# Patient Record
Sex: Male | Born: 1980
Health system: Southern US, Community
[De-identification: ages and names within clinical notes are randomized; demographics above are authoritative.]

## PROBLEM LIST (undated history)

## (undated) DIAGNOSIS — R079 Chest pain, unspecified: Secondary | ICD-10-CM

## (undated) DIAGNOSIS — J301 Allergic rhinitis due to pollen: Secondary | ICD-10-CM

## (undated) DIAGNOSIS — R011 Cardiac murmur, unspecified: Secondary | ICD-10-CM

## (undated) DIAGNOSIS — I351 Nonrheumatic aortic (valve) insufficiency: Secondary | ICD-10-CM

## (undated) DIAGNOSIS — J3089 Other allergic rhinitis: Secondary | ICD-10-CM

## (undated) DIAGNOSIS — H1045 Other chronic allergic conjunctivitis: Secondary | ICD-10-CM

## (undated) DIAGNOSIS — I340 Nonrheumatic mitral (valve) insufficiency: Secondary | ICD-10-CM

## (undated) DIAGNOSIS — R42 Dizziness and giddiness: Secondary | ICD-10-CM

## (undated) DIAGNOSIS — E7800 Pure hypercholesterolemia, unspecified: Secondary | ICD-10-CM

## (undated) DIAGNOSIS — R1013 Epigastric pain: Secondary | ICD-10-CM

## (undated) HISTORY — DX: Allergic rhinitis due to pollen: J30.1

## (undated) HISTORY — DX: Dizziness and giddiness: R42

## (undated) HISTORY — DX: Nonrheumatic mitral (valve) insufficiency: I34.0

## (undated) HISTORY — PX: TRANSTHORACIC ECHOCARDIOGRAM: SHX275

## (undated) HISTORY — DX: Epigastric pain: R10.13

## (undated) HISTORY — DX: Nonrheumatic aortic (valve) insufficiency: I35.1

## (undated) HISTORY — DX: Other allergic rhinitis: J30.89

## (undated) HISTORY — DX: Other chronic allergic conjunctivitis: H10.45

## (undated) HISTORY — DX: Chest pain, unspecified: R07.9

---

## 2014-06-16 DIAGNOSIS — I351 Nonrheumatic aortic (valve) insufficiency: Secondary | ICD-10-CM

## 2014-06-16 HISTORY — DX: Nonrheumatic aortic (valve) insufficiency: I35.1

## 2014-07-06 ENCOUNTER — Encounter: Payer: Self-pay | Admitting: Family Medicine

## 2014-07-06 ENCOUNTER — Ambulatory Visit (INDEPENDENT_AMBULATORY_CARE_PROVIDER_SITE_OTHER): Payer: Commercial Managed Care - PPO | Admitting: Family Medicine

## 2014-07-06 VITALS — BP 119/73 | HR 61 | Temp 97.2°F | Resp 18 | Ht 69.0 in | Wt 223.0 lb

## 2014-07-06 DIAGNOSIS — J302 Other seasonal allergic rhinitis: Secondary | ICD-10-CM | POA: Diagnosis not present

## 2014-07-06 DIAGNOSIS — J3081 Allergic rhinitis due to animal (cat) (dog) hair and dander: Secondary | ICD-10-CM | POA: Diagnosis not present

## 2014-07-06 DIAGNOSIS — R011 Cardiac murmur, unspecified: Secondary | ICD-10-CM

## 2014-07-06 DIAGNOSIS — Z9289 Personal history of other medical treatment: Secondary | ICD-10-CM

## 2014-07-06 MED ORDER — FEXOFENADINE HCL 180 MG PO TABS: 180.0000 mg | ORAL_TABLET | Freq: Every day | ORAL | Status: DC

## 2014-07-06 MED ORDER — FLUTICASONE PROPIONATE 50 MCG/ACT NA SUSP: 2.0000 | Freq: Every day | NASAL | Status: DC

## 2014-07-06 NOTE — Progress Notes (Signed)
Office Note 07/06/2014  CC:  Chief Complaint  Patient presents with  . Establish Care    HPI:  Frederick Hartman is a 34 y.o. Hispanic male who is here to establish care.  Mild language barrier due to pt not speaking english that well but it was sufficient. Patient's most recent primary MD: none in [place] (last was in [place] Old records were not reviewed prior to or during today's visit.  Has hx of nasal congestion/runny nose/sneezing, eye itching, worse in the last week.  Some cough in evenings that he feels is due to abnormal throat sensation and not due to wheezing/chest tightness/asthma.  Denies SOB. He reports known allergy to cats and "environmental" allergies.  Also seems to react to many different odors/scents.  Currently using generic allegra 180mg  qd and benadryl hs, plus OTc allergy eye drops. Says about 10 yrs ago he had allergy testing + a brief time on allergy shots.  Says he recalls responding well to the shots at that time.  He is interested in allergy testing and restarting allergy shots.  Pt does not exercise regularly but when he does he has no CP, SOB, dizziness, presyncope, or palpitations. He denies any past known hx of cardiac problems, no hx of a heart murmur that he recalls.   Past Medical History  Diagnosis Date  . Allergic rhinitis due to cat hair     and pollen  ? others  History reviewed. No pertinent past surgical history.  Family History  Problem Relation Age of Onset  . Diabetes Father     History   Social History  . Marital Status: Married    Spouse Name: N/A  . Number of Children: N/A  . Years of Education: N/A   Occupational History  . Not on file.   Social History Main Topics  . Smoking status: Never Smoker   . Smokeless tobacco: Never Used  . Alcohol Use: 0.6 oz/week    1 Glasses of wine per week     Comment: socially  . Drug Use: No  . Sexual Activity: Not on file   Other Topics Concern  . Not on file    Social History Narrative   Married, male partner.   No children.   Orig from [place], [place] since [place].   College: BA in Accounting in [place].    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   No tob, occ wine.  No hx of alc/drug abuse.     Outpatient Encounter Prescriptions as of 07/06/2014  Medication Sig  . diphenhydrAMINE (SOMINEX) 25 MG tablet Take 25 mg by mouth at bedtime as needed for sleep.  . fexofenadine (ALLEGRA) 180 MG tablet Take 1 tablet (180 mg total) by mouth daily.  . fluticasone (FLONASE) 50 MCG/ACT nasal spray Place 2 sprays into both nostrils daily.  Marland Kitchen. Ketotifen Fumarate (RA ANTIHISTAMINE EYE DROPS OP) Apply to eye.  . [DISCONTINUED] fexofenadine (ALLEGRA) 180 MG tablet Take 180 mg by mouth daily.    No Known Allergies  ROS Review of Systems  Constitutional: Negative for fever and fatigue.  HENT: Negative for congestion and sore throat.   Eyes: Negative for visual disturbance.  Respiratory: Negative for cough, chest tightness, shortness of breath and wheezing.   Cardiovascular: Negative for chest pain, palpitations and leg swelling.  Gastrointestinal: Negative for nausea and abdominal pain.  Genitourinary: Negative for dysuria.  Musculoskeletal: Negative for back pain and joint swelling.  Skin: Negative for rash.  Neurological:  Negative for weakness and headaches.  Hematological: Negative for adenopathy.    PE; Blood pressure 119/73, pulse 61, temperature 97.2 F (36.2 C), temperature source Temporal, resp. rate 18, height  (1.753 m), weight 223 lb (101.152 kg), SpO2 98 %. Gen: Alert, well appearing.  Patient is oriented to person, place, time, and situation. ENT: Ears: EACs clear, normal epithelium.  TMs with good light reflex and landmarks bilaterally.  Eyes: no injection, icteris, swelling, or exudate.  EOMI, PERRLA. Nose: no drainage.  Minimal turbinate edema.  No purulent d/c.  No injection or focal lesion.  Mouth:  lips without lesion/swelling.  No paranasal sinus tenderness or facial swelling.  Oral mucosa pink and moist.  Dentition intact and without obvious caries or gingival swelling.  Oropharynx without erythema, exudate, or swelling.  Neck - No masses or thyromegaly or limitation in range of motion CV: RRR, 2/6 DIASTOLIC murmur, heard at sternal border and best at heard at the apex.  No systolic murmur. S1 clear, S2 less distinct.  No rub or gallop. LUNGS: CTA bilat, nonlabored resps. ABD: soft, NT/ND EXT: no clubbing, cyanosis, or edema.  SKIN: no rash  Pertinent labs:   12 lead EKG today: sinus bradycardia, rate 56, no ST or T wave abnormalities.  Normal intervals/durations. Low voltage lead III, aVF, and V3--suspect poor lead adherence/pt very sweaty per nurse.  Borderline LVH by aVL criteria.  No prior EKG for comparison.  ASSESSMENT AND PLAN:   New pt; no old records to obtain.  1) Allergic rhinitis and allergic conjunctivitis: continue allegra  qd and OTC antihistamine eye drops. Rx flonase 2 sprays each nostril qd. Refer to allergist: pt interested in allergy testing and possible allergy shots.  2) newly recognized heart murmur--diastolic.  Suspect mitral stenosis. Asymptomatic. EKG today: possible LVH. Echocardiogram ordered. Refer to cardiologist after echo results if significant finding is noted.  An After Visit Summary was printed and given to the patient.  Follow up: To be determined based on results of pending workup.

## 2014-07-06 NOTE — Progress Notes (Signed)
Pre visit review using our clinic review tool, if applicable. No additional management support is needed unless otherwise documented below in the visit note. 

## 2014-07-14 ENCOUNTER — Ambulatory Visit (HOSPITAL_COMMUNITY): Payer: Commercial Managed Care - PPO | Attending: Cardiovascular Disease | Admitting: Cardiology

## 2014-07-14 ENCOUNTER — Encounter: Payer: Self-pay | Admitting: Family Medicine

## 2014-07-14 DIAGNOSIS — R011 Cardiac murmur, unspecified: Secondary | ICD-10-CM

## 2014-07-14 DIAGNOSIS — I351 Nonrheumatic aortic (valve) insufficiency: Secondary | ICD-10-CM | POA: Insufficient documentation

## 2014-07-14 NOTE — Progress Notes (Signed)
Echo performed. 

## 2014-07-15 ENCOUNTER — Encounter: Payer: Self-pay | Admitting: Family Medicine

## 2014-07-18 ENCOUNTER — Other Ambulatory Visit: Payer: Self-pay | Admitting: Family Medicine

## 2014-07-18 ENCOUNTER — Encounter: Payer: Self-pay | Admitting: Family Medicine

## 2014-07-18 DIAGNOSIS — I351 Nonrheumatic aortic (valve) insufficiency: Secondary | ICD-10-CM

## 2014-07-18 DIAGNOSIS — R011 Cardiac murmur, unspecified: Secondary | ICD-10-CM

## 2014-09-05 ENCOUNTER — Institutional Professional Consult (permissible substitution): Payer: Commercial Managed Care - PPO | Admitting: Cardiology

## 2014-09-15 ENCOUNTER — Ambulatory Visit: Payer: Commercial Managed Care - PPO | Admitting: Internal Medicine

## 2014-09-15 ENCOUNTER — Encounter: Payer: Self-pay | Admitting: Internal Medicine

## 2014-09-15 ENCOUNTER — Ambulatory Visit (INDEPENDENT_AMBULATORY_CARE_PROVIDER_SITE_OTHER): Payer: Commercial Managed Care - PPO | Admitting: Internal Medicine

## 2014-09-15 VITALS — BP 128/62 | HR 96 | Ht 69.0 in | Wt 227.4 lb

## 2014-09-15 DIAGNOSIS — I340 Nonrheumatic mitral (valve) insufficiency: Secondary | ICD-10-CM

## 2014-09-15 NOTE — Patient Instructions (Signed)
Your physician recommends that you continue on your current medications as directed. Please refer to the Current Medication list given to you today.  Your physician has requested that you have an echocardiogram. Echocardiography is a painless test that uses sound waves to create images of your heart. It provides your doctor with information about the size and shape of your heart and how well your heart's chambers and valves are working. This procedure takes approximately one hour. There are no restrictions for this procedure.  ---PLEASE SCHEDULE IN December, CAN BE SAME DAY AS DR ROSS APPOINTMENT.  Your physician wants you to follow-up in: December 2016 WITH DR. Tenny CrawOSS.   You will receive a reminder letter in the mail two months in advance. If you don't receive a letter, please call our office to schedule the follow-up appointment.

## 2014-09-15 NOTE — Progress Notes (Signed)
Cardiology Office Note   Date:  09/15/2014   ID:  Frederick Hartman, [place] 02-Mar-1981, MRN [id number]  PCP:  Jeoffrey MassedMCGOWEN,PHILIP H, MD  Cardiologist:   Dietrich PatesPaula Kirtan Sada, MD   No chief complaint on file.  Patient referred for eval of murmur   History of Present Illness: Frederick Hartman is a 34 y.o. male is followed by Dr Milinda CaveMcgowen Murmur was picked up on exam  Echo done that showed normal LV functoin and mod to severe AI The pt notes occaisonal CP  Began about 3 years ago  Not associated wht physcial activity  COmes when stressed  No SOB  Lasts seconds  Noticeable.  Occasionally pleruitic Pt works as Airline pilotaccountant.  Does not exercise  No SOB   No PND    Brother with murmur  [place]  Current Outpatient Prescriptions  Medication Sig Dispense Refill  . fluticasone (FLONASE) 50 MCG/ACT nasal spray Place 2 sprays into both nostrils daily. 16 g 6   No current facility-administered medications for this visit.    Allergies:   Review of patient's allergies indicates no known allergies.   Past Medical History  Diagnosis Date  . Other allergic rhinitis     grasses, weeds, trees, apergillus  . Allergic rhinitis due to pollen   . Chronic allergic conjunctivitis   . Heart murmur 06/2014    Diastolic: Transth echo showed mod/severe Aortic regurg, aortic valve not well visualized, transesoph echo recommended.  Otherwise the echo was normal.  Cardiology referral ordered 07/18/14.    Past Surgical History  Procedure Laterality Date  . Transthoracic echocardiogram  07/14/14    Mod/severe Aortic regurg.  EF 60-65%, normal LV size and wall motion.  Aortic valve not well visualized: transesoph echo to follow.     Social History:  The patient  reports that he has never smoked. He has never used smokeless tobacco. He reports that he drinks about 0.6 oz of alcohol per week. He reports that he does not use illicit drugs.   Family History:  The patient's family history includes Diabetes  in his father.    ROS:  Please see the history of present illness. All other systems are reviewed and  Negative to the above problem except as noted.    PHYSICAL EXAM: VS:  BP 128/62 mmHg  Pulse 96  Ht 5\' 9"  (1.753 m)  Wt 227 lb 6.4 oz (103.148 kg)  BMI 33.57 kg/m2  SpO2 96%  GEN: Well nourished, well developed, in no acute distress HEENT: normal Neck: no JVD, carotid bruits, or masses Cardiac:  RRR  Gr II/VI systolic murmur LSB   Gr II/VI diastolic murmur LSB   rubs, or gallops,no edema  Respiratory:  clear to auscultation bilaterally, normal work of breathing GI: soft, nontender, nondistended, + BS  No hepatomegaly  MS: no deformity Moving all extremities   Skin: warm and dry, no rash Neuro:  Strength and sensation are intact Psych: euthymic mood, full affect   EKG:  EKG is not ordered today.   Lipid Panel No results found for: CHOL, TRIG, HDL, CHOLHDL, VLDL, LDLCALC, LDLDIRECT    Wt Readings from Last 3 Encounters:  09/15/14 227 lb 6.4 oz (103.148 kg)  07/06/14 223 lb (101.152 kg)      ASSESSMENT AND PLAN: 1  Aortic insufficiency  I have reviewed echo images  LV is nromal in size and has normal systolic function  AV is difficult to see well  May be bicuspid  AI appears to be probably moderate , mod to severe. I have discussed with pt  He is asymptomatic  I did encourage him to walk I owuld recomm that he gt an echo in 6 mon  I will see him at that time. LV is well within normal in size I would not plan TEE now   I would recomm that the pt take antibiotics before denatal work  2.  HCM   Will f/u on lipids   Encouraged him to exercise     Disposition:   FU with me in 6 months with echo Signed, Dietrich Pates, MD  09/15/2014 3:41 PM    [facility] [address] [place], [place], [place]  [address] Phone: [phone]; Fax: [phone]

## 2014-12-08 ENCOUNTER — Encounter: Payer: Self-pay | Admitting: Family Medicine

## 2015-03-29 ENCOUNTER — Encounter: Payer: Self-pay | Admitting: Internal Medicine

## 2015-03-29 ENCOUNTER — Ambulatory Visit (INDEPENDENT_AMBULATORY_CARE_PROVIDER_SITE_OTHER): Payer: Commercial Managed Care - PPO | Admitting: Internal Medicine

## 2015-03-29 VITALS — BP 128/66 | HR 87 | Ht 70.0 in | Wt 233.0 lb

## 2015-03-29 DIAGNOSIS — I351 Nonrheumatic aortic (valve) insufficiency: Secondary | ICD-10-CM | POA: Diagnosis not present

## 2015-03-29 NOTE — Patient Instructions (Signed)
Your physician recommends that you continue on your current medications as directed. Please refer to the Current Medication list given to you today. Your physician has requested that you have an echocardiogram. Echocardiography is a painless test that uses sound waves to create images of your heart. It provides your doctor with information about the size and shape of your heart and how well your heart's chambers and valves are working. This procedure takes approximately one hour. There are no restrictions for this procedure.  HAVE THIS ECHO AT YOUR EARLIEST CONVENIENCE.  Your physician wants you to follow-up in: 1 YEAR WITH DR. Tenny CrawOSS.  You will receive a reminder letter in the mail two months in advance. If you don't receive a letter, please call our office to schedule the follow-up appointment.   PLEASE SCHEDULE AN ECHOCARDIOGRAM SAME DAY AS YOUR NEXT DR. ROSS APPOINTMENT.

## 2015-03-29 NOTE — Progress Notes (Signed)
   Cardiology Office Note   Date:  03/29/2015   ID:  Frederick Hartman, [place]OB 1980-04-03, MRN [id number]  PCP:  Jeoffrey MassedMCGOWEN,PHILIP H, MD  Cardiologist:   Dietrich PatesPaula Ross, MD    Follow up of aortic valve dz/     History of Present Illness: Werner Sherron FlemingsDe La Frederick AgeeGarza Hartman is a 35 y.o. male with a history ofMod to severe AI  Normal LV function   I saw him    Breathing is good No CP  No SOB  Does not exercise regularly  Bought a treadmill       No current outpatient prescriptions on file.   No current facility-administered medications for this visit.    Allergies:   Review of patient's allergies indicates no known allergies.   Past Medical History  Diagnosis Date  . Other allergic rhinitis     grasses, weeds, trees, apergillus (allergy immunotherapy)  . Allergic rhinitis due to pollen   . Chronic allergic conjunctivitis   . Heart murmur 06/2014    Diastolic: Transth echo showed mod/severe Aortic regurg, aortic valve not well visualized, transesoph echo recommended.  Otherwise the echo was normal.  Cardiology referral ordered 07/18/14.    Past Surgical History  Procedure Laterality Date  . Transthoracic echocardiogram  07/14/14    Mod/severe Aortic regurg.  EF 60-65%, normal LV size and wall motion.  Aortic valve not well visualized: transesoph echo to follow.     Social History:  The patient  reports that he has never smoked. He has never used smokeless tobacco. He reports that he drinks about 0.6 oz of alcohol per week. He reports that he does not use illicit drugs.   Family History:  The patient's family history includes Diabetes in his father.    ROS:  Please see the history of present illness. All other systems are reviewed and  Negative to the above problem except as noted.    PHYSICAL EXAM: VS:  BP 128/66 mmHg  Pulse 87  Ht 5\' 10"  (1.778 m)  Wt 105.688 kg (233 lb)  BMI 33.43 kg/m2  SpO2 96%  GEN: Well nourished, well developed, in no acute distress HEENT:  normal Neck: no JVD, carotid bruits, or masses Cardiac: RRR; Gr I-II/VI systolic murmur  Gr I/VI diastolic murmur No rubs, or gallops,no edema  Respiratory:  clear to auscultation bilaterally, normal work of breathing GI: soft, nontender, nondistended, + BS  No hepatomegaly  MS: no deformity Moving all extremities   Skin: warm and dry, no rash Neuro:  Strength and sensation are intact Psych: euthymic mood, full affect   EKG:  EKG is not ordered today.   Lipid Panel No results found for: CHOL, TRIG, HDL, CHOLHDL, VLDL, LDLCALC, LDLDIRECT    Wt Readings from Last 3 Encounters:  03/29/15 105.688 kg (233 lb)  09/15/14 103.148 kg (227 lb 6.4 oz)  07/06/14 101.152 kg (223 lb)      ASSESSMENT AND PLAN:  1.  Aortic valve dz/  Will repeat echo to reeval LV  If unchanged Set up for f/u in 1 year with echo on day of visit  Stay acitve     Signed, Dietrich PatesPaula Ross, MD  03/29/2015 4:39 PM    [facility] [address], [place], [place]  [address] Phone: [phone]; Fax: [phone]

## 2015-04-10 ENCOUNTER — Other Ambulatory Visit: Payer: Self-pay

## 2015-04-10 ENCOUNTER — Ambulatory Visit (HOSPITAL_COMMUNITY): Payer: Commercial Managed Care - PPO | Attending: Internal Medicine

## 2015-04-10 DIAGNOSIS — I517 Cardiomegaly: Secondary | ICD-10-CM | POA: Diagnosis not present

## 2015-04-10 DIAGNOSIS — I351 Nonrheumatic aortic (valve) insufficiency: Secondary | ICD-10-CM | POA: Diagnosis not present

## 2015-04-10 DIAGNOSIS — I359 Nonrheumatic aortic valve disorder, unspecified: Secondary | ICD-10-CM | POA: Diagnosis present

## 2015-04-26 ENCOUNTER — Other Ambulatory Visit: Payer: Self-pay | Admitting: *Deleted

## 2015-04-26 DIAGNOSIS — I351 Nonrheumatic aortic (valve) insufficiency: Secondary | ICD-10-CM

## 2016-04-21 ENCOUNTER — Ambulatory Visit (HOSPITAL_COMMUNITY): Payer: Commercial Managed Care - PPO | Attending: Cardiology

## 2016-04-21 ENCOUNTER — Other Ambulatory Visit: Payer: Self-pay

## 2016-04-21 ENCOUNTER — Ambulatory Visit (INDEPENDENT_AMBULATORY_CARE_PROVIDER_SITE_OTHER): Payer: Commercial Managed Care - PPO | Admitting: Internal Medicine

## 2016-04-21 ENCOUNTER — Encounter (INDEPENDENT_AMBULATORY_CARE_PROVIDER_SITE_OTHER): Payer: Self-pay

## 2016-04-21 ENCOUNTER — Encounter: Payer: Self-pay | Admitting: Internal Medicine

## 2016-04-21 VITALS — BP 124/39 | HR 61 | Ht 70.0 in | Wt 241.0 lb

## 2016-04-21 DIAGNOSIS — I351 Nonrheumatic aortic (valve) insufficiency: Secondary | ICD-10-CM

## 2016-04-21 DIAGNOSIS — I371 Nonrheumatic pulmonary valve insufficiency: Secondary | ICD-10-CM | POA: Diagnosis not present

## 2016-04-21 NOTE — Progress Notes (Signed)
Cardiology Office Note   Date:  04/21/2016   ID:  Frederick Hartman, [place]OB 20-Dec-1980, MRN [id number]  PCP:  Jeoffrey MassedMCGOWEN,PHILIP H, MD  Cardiologist:   Dietrich PatesPaula Terianna Peggs, MD   F/U of aortic insufficiency    History of Present Illness: Frederick Hartman is a 36 y.o. male with a history of bicusp aortic valve and aortic insufficiency  I saw him in clinic last year Since seen he has done well  Active  Exerises (treadmill)  No dizziness  No CP   No SOB       No outpatient prescriptions have been marked as taking for the 04/21/16 encounter (Office Visit) with Pricilla RifflePaula Jorita Bohanon V, MD.     Allergies:   Patient has no known allergies.   Past Medical History:  Diagnosis Date  . Allergic rhinitis due to pollen   . Chronic allergic conjunctivitis   . Heart murmur 06/2014   Diastolic: Transth echo showed mod/severe Aortic regurg, aortic valve not well visualized, transesoph echo recommended.  Otherwise the echo was normal.  Cardiology referral ordered 07/18/14.  . Other allergic rhinitis    grasses, weeds, trees, apergillus (allergy immunotherapy)    Past Surgical History:  Procedure Laterality Date  . TRANSTHORACIC ECHOCARDIOGRAM  07/14/14   Mod/severe Aortic regurg.  EF 60-65%, normal LV size and wall motion.  Aortic valve not well visualized: transesoph echo to follow.     Social History:  The patient  reports that he has never smoked. He has never used smokeless tobacco. He reports that he drinks about 0.6 oz of alcohol per week . He reports that he does not use drugs.   Family History:  The patient's family history includes Diabetes in his father.    ROS:  Please see the history of present illness. All other systems are reviewed and  Negative to the above problem except as noted.    PHYSICAL EXAM: VS:  BP (!) 124/39   Pulse 61   Ht 5\' 10"  (1.778 m)   Wt 241 lb (109.3 kg)   BMI 34.58 kg/m   GEN: Well nourished, well developed, in no acute distress  HEENT: normal  Neck:  no JVD, carotid bruits, or masses Cardiac: RRR; Gr II/VI systolic murmur  Gr I-II/Vi diastolic murmur  rubs, or gallops,no edema  Respiratory:  clear to auscultation bilaterally, normal work of breathing GI: soft, nontender, nondistended, + BS  No hepatomegaly  MS: no deformity Moving all extremities   Skin: warm and dry, no rash Neuro:  Strength and sensation are intact Psych: euthymic mood, full affect   EKG:  EKG is ordered today.  SR 61 bpm  St changes consistent with early repolarization     Lipid Panel No results found for: CHOL, TRIG, HDL, CHOLHDL, VLDL, LDLCALC, LDLDIRECT    Wt Readings from Last 3 Encounters:  04/21/16 241 lb (109.3 kg)  03/29/15 233 lb (105.7 kg)  09/15/14 227 lb 6.4 oz (103.1 kg)      ASSESSMENT AND PLAN:  1  Aortic insufficiency  Echo done today  LVESD 31   LVEDD 52 AI severe  Aorta appears normal   I would continue to follow  Continue aerobic acitvity  Avoid extreme heavy wts    F/U in 1 year with echo    Current medicines are reviewed at length with the patient today.  The patient does not have concerns regarding medicines.  Signed, Dietrich PatesPaula Virgil Lightner, MD  04/21/2016 6:10 PM  [facility] [place], [place], [place]  [address] Phone: [phone]; Fax: [phone]

## 2016-04-21 NOTE — Patient Instructions (Signed)
Your physician recommends that you continue on your current medications as directed. Please refer to the Current Medication list given to you today. Your physician wants you to follow-up in: 1 year with Dr. Ross.  You will receive a reminder letter in the mail two months in advance. If you don't receive a letter, please call our office to schedule the follow-up appointment.  

## 2016-04-30 ENCOUNTER — Other Ambulatory Visit: Payer: Self-pay | Admitting: *Deleted

## 2016-04-30 DIAGNOSIS — I351 Nonrheumatic aortic (valve) insufficiency: Secondary | ICD-10-CM

## 2016-05-13 ENCOUNTER — Telehealth: Payer: Self-pay | Admitting: Family Medicine

## 2016-05-13 ENCOUNTER — Emergency Department (HOSPITAL_BASED_OUTPATIENT_CLINIC_OR_DEPARTMENT_OTHER)
Admission: EM | Admit: 2016-05-13 | Discharge: 2016-05-13 | Disposition: A | Discharge status: Home / Self Care | Payer: Commercial Managed Care - PPO | Attending: Emergency Medicine | Admitting: Emergency Medicine

## 2016-05-13 ENCOUNTER — Emergency Department (HOSPITAL_BASED_OUTPATIENT_CLINIC_OR_DEPARTMENT_OTHER): Payer: Commercial Managed Care - PPO

## 2016-05-13 ENCOUNTER — Encounter (HOSPITAL_BASED_OUTPATIENT_CLINIC_OR_DEPARTMENT_OTHER): Payer: Self-pay | Admitting: *Deleted

## 2016-05-13 DIAGNOSIS — K219 Gastro-esophageal reflux disease without esophagitis: Secondary | ICD-10-CM | POA: Diagnosis not present

## 2016-05-13 DIAGNOSIS — R079 Chest pain, unspecified: Secondary | ICD-10-CM | POA: Diagnosis present

## 2016-05-13 DIAGNOSIS — R0602 Shortness of breath: Secondary | ICD-10-CM | POA: Diagnosis not present

## 2016-05-13 DIAGNOSIS — R200 Anesthesia of skin: Secondary | ICD-10-CM | POA: Diagnosis not present

## 2016-05-13 HISTORY — DX: Cardiac murmur, unspecified: R01.1

## 2016-05-13 LAB — BASIC METABOLIC PANEL
Anion gap: 6 (ref 5–15)
BUN: 13 mg/dL (ref 6–20)
CALCIUM: 9.2 mg/dL (ref 8.9–10.3)
CO2: 27 mmol/L (ref 22–32)
Chloride: 107 mmol/L (ref 101–111)
Creatinine, Ser: 0.8 mg/dL (ref 0.61–1.24)
Glucose, Bld: 101 mg/dL — ABNORMAL HIGH (ref 65–99)
Potassium: 4.1 mmol/L (ref 3.5–5.1)
SODIUM: 140 mmol/L (ref 135–145)

## 2016-05-13 LAB — CBC WITH DIFFERENTIAL/PLATELET
Basophils Absolute: 0 10*3/uL (ref 0.0–0.1)
Basophils Relative: 0 %
EOS ABS: 0 10*3/uL (ref 0.0–0.7)
EOS PCT: 1 %
HCT: 45 % (ref 39.0–52.0)
Hemoglobin: 15.5 g/dL (ref 13.0–17.0)
Lymphocytes Relative: 29 %
Lymphs Abs: 2 10*3/uL (ref 0.7–4.0)
MCH: 30.5 pg (ref 26.0–34.0)
MCHC: 34.4 g/dL (ref 30.0–36.0)
MCV: 88.6 fL (ref 78.0–100.0)
MONO ABS: 0.6 10*3/uL (ref 0.1–1.0)
Monocytes Relative: 9 %
Neutro Abs: 4.3 10*3/uL (ref 1.7–7.7)
Neutrophils Relative %: 61 %
PLATELETS: 200 10*3/uL (ref 150–400)
RBC: 5.08 MIL/uL (ref 4.22–5.81)
RDW: 12.7 % (ref 11.5–15.5)
WBC: 6.9 10*3/uL (ref 4.0–10.5)

## 2016-05-13 LAB — TROPONIN I

## 2016-05-13 MED ORDER — PANTOPRAZOLE SODIUM 40 MG PO TBEC: 40.0000 mg | DELAYED_RELEASE_TABLET | Freq: Every day | ORAL | 0 refills | Status: DC

## 2016-05-13 MED ORDER — FAMOTIDINE 20 MG PO TABS: 20.0000 mg | ORAL_TABLET | Freq: Two times a day (BID) | ORAL | 0 refills | Status: DC

## 2016-05-13 NOTE — Telephone Encounter (Signed)
Spoke with patient regarding symptoms. Patient reports chest pain this morning around 2 am that he associated with heartburn, relieved some by antacid. Woke up again with chest pain, difficulty breathing and dizziness this morning. Patient states he is not experiencing chest pain currently but continues to be dizzy. Patient advised to have someone take him to the Emergency Department for evaluation. Patient verbalized understanding, [facility]   Patient also requesting transfer of care to Dr. Drue NovelPaz.

## 2016-05-13 NOTE — ED Notes (Signed)
Pt on the monitor 

## 2016-05-13 NOTE — Telephone Encounter (Signed)
Patient Name: Frederick Hartman  DOB: 1980-03-31    Initial Comment Caller states heart pain woke him up at 2am, he has a heart murmur, visiting MD every year. Happened before with a little bit of pain, goes away when he lays down. Worse this morning, dizziness, trouble breathing, panic attack, two asprin, and fell asleep again. He is wondering what else to do. Heart is no different from appt this year, watching diet. MD: Noel Geroldohen   Nurse Assessment  Nurse: Renaldo FiddlerAdkins, RN, Raynelle FanningJulie Date/Time Frederick Hartman(Eastern Time): 05/13/2016 10:09:05 AM  Confirm and document reason for call. If symptomatic, describe symptoms. ---Caller states he had an episode of chest pain this morning at 2 am that woke him up. He does have a heart murmur, saw his cardiologist on 04/21/2016, sees him 1 x per year. This morning his chest pain was worse, he felt dizzy, was sob and felt like he was having a panic attack. He not having chest pain at this time, but all other sx have resolved. Sx began 1 week ago. stated chest pain lasts only minutes.  Does the patient have any new or worsening symptoms? ---Yes  Will a triage be completed? ---Yes  Related visit to physician within the last 2 weeks? ---Yes  Does the PT have any chronic conditions? (i.e. diabetes, asthma, etc.) ---Yes  List chronic conditions. ---Heart murmur  Is this a behavioral health or substance abuse call? ---No     Guidelines    Guideline Title Affirmed Question Affirmed Notes  Chest Pain [1] Chest pain lasting <= 5 minutes AND [2] NO chest pain or cardiac symptoms now (Exceptions: pains lasting a few seconds)    Final Disposition User   See Physician within 24 Hours Renaldo FiddlerAdkins, RN, Raynelle FanningJulie    Referrals  REFERRED TO PCP OFFICE   Disagree/Comply: Danella Maiersomply

## 2016-05-13 NOTE — Telephone Encounter (Signed)
Noted. Ok with me to transfer to Dr. Drue NovelPaz as per pt request.

## 2016-05-13 NOTE — ED Triage Notes (Addendum)
Pt reports L side chest pain that woke him up at 2:00 this morning. Reports nausea, denies vomiting. States pain has stopped, reports having SOB earlier which has also subsided. Pt stages he over ate last night and has also had heart burn.

## 2016-05-13 NOTE — ED Notes (Signed)
Pt verbalized understanding of discharge instructions and denies any further questions at this time.   

## 2016-05-13 NOTE — Telephone Encounter (Signed)
Yes, ok with me 

## 2016-05-13 NOTE — Telephone Encounter (Signed)
Okay, please arrange office visit here to get established

## 2016-05-13 NOTE — ED Provider Notes (Signed)
[facility] Provider Note   CSN: [id number] Arrival date & time: 05/13/16  1337     History   Chief Complaint Chief Complaint  Patient presents with  . Chest Pain    HPI Nephi Sherron Flemings Janeece Agee is a 36 y.o. male.  HPI  36 year old male presents with chest pain since around 2 AM. It woke him up out of sleep. It feels like a burning in the middle of his chest. It radiated up his chest and he started to experience reflux. He also has had shortness of breath and some nausea without vomiting. He also felt like his entire body was numb. This has been on and off since 2 AM. However now the chest pain has gone away. It initially improved when he took some antacids. He thinks that he had reflux because he "over ate" last night. He states this is typically what causes him to have reflux. Currently feels completely asymptomatic. When his whole body was numb there was no weakness but he felt a diffuse heaviness in his entire body. No headaches. No back pain.  Past Medical History:  Diagnosis Date  . Allergic rhinitis due to pollen   . Chronic allergic conjunctivitis   . Murmur   . Other allergic rhinitis    grasses, weeds, trees, apergillus (allergy immunotherapy)  . Severe aortic insufficiency 06/2014   Diastolic murmur: Transth echo showed mod/severe Aortic regurg, aortic valve not well visualized, transesoph echo recommended.  Otherwise the echo was normal.  Transesoph echo showed bicuspid AV.    There are no active problems to display for this patient.   Past Surgical History:  Procedure Laterality Date  . TRANSTHORACIC ECHOCARDIOGRAM  07/14/14; 20/1/18   2016: Mod/severe Aortic regurg.  EF 60-65%, normal LV size and wall motion.  Aortic valve not well visualized: transesoph echo to follow..  Repeat 04/2016 showed EF 60-65%, severe AI w/ bicuspid AV, grade 2 DD.       Home Medications    Prior to Admission medications   Medication Sig Start Date End Date Taking?  Authorizing Provider  famotidine (PEPCID) 20 MG tablet Take 1 tablet (20 mg total) by mouth 2 (two) times daily. 05/13/16   Pricilla Loveless, MD  pantoprazole (PROTONIX) 40 MG tablet Take 1 tablet (40 mg total) by mouth daily. 05/13/16   Pricilla Loveless, MD    Family History Family History  Problem Relation Age of Onset  . Diabetes Father     Social History Social History  Substance Use Topics  . Smoking status: Never Smoker  . Smokeless tobacco: Never Used  . Alcohol use 0.6 oz/week    1 Glasses of wine per week     Comment: socially     Allergies   Patient has no known allergies.   Review of Systems Review of Systems  Respiratory: Positive for shortness of breath.   Cardiovascular: Positive for chest pain.  Gastrointestinal: Positive for nausea. Negative for abdominal pain and vomiting.  Musculoskeletal: Negative for back pain.  Neurological: Positive for numbness. Negative for weakness and headaches.  All other systems reviewed and are negative.    Physical Exam Updated Vital Signs BP (!) 132/54 (BP Location: Left Arm)   Pulse 88   Temp 98.3 F (36.8 C) (Oral)   Resp 20   Ht 5\' 11"  (1.803 m)   Wt 240 lb (108.9 kg)   SpO2 100%   BMI 33.47 kg/m   Physical Exam  Constitutional: He is oriented to person,  place, and time. He appears well-developed and well-nourished. No distress.  HENT:  Head: Normocephalic and atraumatic.  Right Ear: External ear normal.  Left Ear: External ear normal.  Nose: Nose normal.  Eyes: Right eye exhibits no discharge. Left eye exhibits no discharge.  Neck: Neck supple.  Cardiovascular: Normal rate, regular rhythm and normal heart sounds.   Pulses:      Radial pulses are 2+ on the right side, and 2+ on the left side.  Pulmonary/Chest: Effort normal and breath sounds normal. He exhibits no tenderness.  Abdominal: Soft. There is no tenderness.  Musculoskeletal: He exhibits no edema.  Neurological: He is alert and oriented to person,  place, and time.  CN 3-12 grossly intact. 5/5 strength in all 4 extremities. Grossly normal sensation. Normal finger to nose.   Skin: Skin is warm and dry. He is not diaphoretic.  Nursing note and vitals reviewed.    ED Treatments / Results  Labs (all labs ordered are listed, but only abnormal results are displayed) Labs Reviewed  BASIC METABOLIC PANEL - Abnormal; Notable for the following:       Result Value   Glucose, Bld 101 (*)    All other components within normal limits  TROPONIN I  CBC WITH DIFFERENTIAL/PLATELET    EKG  EKG Interpretation  Date/Time:  Tuesday May 13 2016 14:23:55 EST Ventricular Rate:  70 PR Interval:    QRS Duration: 90 QT Interval:  370 QTC Calculation: 400 R Axis:   22 Text Interpretation:  Sinus rhythm Abnormal R-wave progression, early transition ST elevation likely early repol no significant change since earlier in the day Confirmed by Preeti Winegardner MD, Aris Moman [phone]) on 05/13/2016 2:28:18 PM       Radiology Dg Chest 2 View  Result Date: 05/13/2016 CLINICAL DATA:  Left-sided chest pain that awakens him at 2 a.m. today. Transient nausea and shortness of breath. Possible over eating last night with subsequent heartburn. History of aortic regurgitation. EXAM: CHEST  2 VIEW COMPARISON:  None in PACs FINDINGS: The lungs are adequately inflated. There is no focal infiltrate. There is no pleural effusion. The heart and pulmonary vascularity are normal. The mediastinum is normal in width. The trachea is midline. There is no pleural effusion. The bony thorax exhibits no acute abnormality. IMPRESSION: There is no pneumonia, CHF, nor other acute cardiopulmonary abnormality. Electronically Signed   By: David  [place] M.D.   On: 05/13/2016 14:37    Procedures Procedures (including critical care time)  Medications Ordered in ED Medications - No data to display   Initial Impression / Assessment and Plan / ED Course  I have reviewed the triage vital signs  and the nursing notes.  Pertinent labs & imaging results that were available during my care of the patient were reviewed by me and considered in my medical decision making (see chart for details).  Clinical Course as of May 13 1530  Tue May 13, 2016  1419 Patient's chest pain was most likely GERD related. He is a symptomatically now. ECG unremarkable. Will do labs, chest x-ray, and his pain discharge with PPI and H2 blocker. Low suspicion for ACS  [SG]  1530 Patient's chest x-ray and labs are unremarkable. ECG with no acute changes. Headache most likely this is GERD. Will discharge with Protonix and Pepcid. Follow-up with PCP. Discussed return precautions.  [SG]    Clinical Course User Index [SG] Pricilla Loveless, MD     Final Clinical Impressions(s) / ED Diagnoses   Final diagnoses:  Nonspecific chest pain  Gastroesophageal reflux disease, esophagitis presence not specified    New Prescriptions New Prescriptions   FAMOTIDINE (PEPCID) 20 MG TABLET    Take 1 tablet (20 mg total) by mouth 2 (two) times daily.   PANTOPRAZOLE (PROTONIX) 40 MG TABLET    Take 1 tablet (40 mg total) by mouth daily.     Pricilla LovelessScott Zhoe Catania, MD 05/13/16 [phone]

## 2016-05-13 NOTE — ED Notes (Signed)
EKG department has been called to check why EKG not transferring over to chart. Will call back in a few minutes if EKG does not appear.

## 2016-05-13 NOTE — Telephone Encounter (Signed)
Pt is asking to change PCP's to Dr Drue NovelPaz based on he language preferences. (Spanish) Ok to transfer to [facility] with Paz?

## 2016-05-13 NOTE — ED Notes (Addendum)
No charge for the first EKG, second one was done due to the first one not transferring over.

## 2016-05-14 ENCOUNTER — Ambulatory Visit: Payer: Self-pay | Admitting: Family Medicine

## 2017-04-01 DIAGNOSIS — J301 Allergic rhinitis due to pollen: Secondary | ICD-10-CM | POA: Diagnosis not present

## 2017-04-17 DIAGNOSIS — J301 Allergic rhinitis due to pollen: Secondary | ICD-10-CM | POA: Diagnosis not present

## 2017-05-01 ENCOUNTER — Ambulatory Visit: Payer: Commercial Managed Care - PPO | Admitting: Internal Medicine

## 2017-05-01 ENCOUNTER — Other Ambulatory Visit (HOSPITAL_COMMUNITY): Payer: Commercial Managed Care - PPO

## 2017-05-07 ENCOUNTER — Encounter: Payer: Self-pay | Admitting: Internal Medicine

## 2017-06-11 DIAGNOSIS — J301 Allergic rhinitis due to pollen: Secondary | ICD-10-CM | POA: Diagnosis not present

## 2017-06-26 DIAGNOSIS — J301 Allergic rhinitis due to pollen: Secondary | ICD-10-CM | POA: Diagnosis not present

## 2017-07-07 DIAGNOSIS — J3089 Other allergic rhinitis: Secondary | ICD-10-CM | POA: Diagnosis not present

## 2017-07-07 DIAGNOSIS — H1045 Other chronic allergic conjunctivitis: Secondary | ICD-10-CM | POA: Diagnosis not present

## 2017-07-07 DIAGNOSIS — J301 Allergic rhinitis due to pollen: Secondary | ICD-10-CM | POA: Diagnosis not present

## 2017-07-16 DIAGNOSIS — J301 Allergic rhinitis due to pollen: Secondary | ICD-10-CM | POA: Diagnosis not present

## 2017-07-22 DIAGNOSIS — J301 Allergic rhinitis due to pollen: Secondary | ICD-10-CM | POA: Diagnosis not present

## 2017-07-31 DIAGNOSIS — J301 Allergic rhinitis due to pollen: Secondary | ICD-10-CM | POA: Diagnosis not present

## 2017-08-06 DIAGNOSIS — J3089 Other allergic rhinitis: Secondary | ICD-10-CM | POA: Diagnosis not present

## 2017-08-06 DIAGNOSIS — J301 Allergic rhinitis due to pollen: Secondary | ICD-10-CM | POA: Diagnosis not present

## 2017-08-11 DIAGNOSIS — M5127 Other intervertebral disc displacement, lumbosacral region: Secondary | ICD-10-CM | POA: Diagnosis not present

## 2017-08-11 DIAGNOSIS — M9903 Segmental and somatic dysfunction of lumbar region: Secondary | ICD-10-CM | POA: Diagnosis not present

## 2017-08-11 DIAGNOSIS — M62830 Muscle spasm of back: Secondary | ICD-10-CM | POA: Diagnosis not present

## 2017-08-13 DIAGNOSIS — M5127 Other intervertebral disc displacement, lumbosacral region: Secondary | ICD-10-CM | POA: Diagnosis not present

## 2017-08-13 DIAGNOSIS — M9903 Segmental and somatic dysfunction of lumbar region: Secondary | ICD-10-CM | POA: Diagnosis not present

## 2017-08-13 DIAGNOSIS — M62830 Muscle spasm of back: Secondary | ICD-10-CM | POA: Diagnosis not present

## 2017-08-14 DIAGNOSIS — J301 Allergic rhinitis due to pollen: Secondary | ICD-10-CM | POA: Diagnosis not present

## 2017-08-17 DIAGNOSIS — M62830 Muscle spasm of back: Secondary | ICD-10-CM | POA: Diagnosis not present

## 2017-08-17 DIAGNOSIS — M9903 Segmental and somatic dysfunction of lumbar region: Secondary | ICD-10-CM | POA: Diagnosis not present

## 2017-08-17 DIAGNOSIS — M5127 Other intervertebral disc displacement, lumbosacral region: Secondary | ICD-10-CM | POA: Diagnosis not present

## 2017-08-20 DIAGNOSIS — M62830 Muscle spasm of back: Secondary | ICD-10-CM | POA: Diagnosis not present

## 2017-08-20 DIAGNOSIS — M9903 Segmental and somatic dysfunction of lumbar region: Secondary | ICD-10-CM | POA: Diagnosis not present

## 2017-08-20 DIAGNOSIS — M5127 Other intervertebral disc displacement, lumbosacral region: Secondary | ICD-10-CM | POA: Diagnosis not present

## 2017-08-20 DIAGNOSIS — J301 Allergic rhinitis due to pollen: Secondary | ICD-10-CM | POA: Diagnosis not present

## 2017-08-24 DIAGNOSIS — M62830 Muscle spasm of back: Secondary | ICD-10-CM | POA: Diagnosis not present

## 2017-08-24 DIAGNOSIS — M9903 Segmental and somatic dysfunction of lumbar region: Secondary | ICD-10-CM | POA: Diagnosis not present

## 2017-08-24 DIAGNOSIS — M5127 Other intervertebral disc displacement, lumbosacral region: Secondary | ICD-10-CM | POA: Diagnosis not present

## 2017-08-26 DIAGNOSIS — J301 Allergic rhinitis due to pollen: Secondary | ICD-10-CM | POA: Diagnosis not present

## 2017-08-26 DIAGNOSIS — J3089 Other allergic rhinitis: Secondary | ICD-10-CM | POA: Diagnosis not present

## 2017-08-27 DIAGNOSIS — M9903 Segmental and somatic dysfunction of lumbar region: Secondary | ICD-10-CM | POA: Diagnosis not present

## 2017-08-27 DIAGNOSIS — M62830 Muscle spasm of back: Secondary | ICD-10-CM | POA: Diagnosis not present

## 2017-08-27 DIAGNOSIS — M5127 Other intervertebral disc displacement, lumbosacral region: Secondary | ICD-10-CM | POA: Diagnosis not present

## 2017-08-31 NOTE — Telephone Encounter (Signed)
Marchelle Folksmanda, will you please check with Dr. Leta JunglingPaz's office to see if they will reach out to pt. Thanks.

## 2017-09-02 NOTE — Telephone Encounter (Signed)
Okay to keep appt 

## 2017-09-02 NOTE — Telephone Encounter (Signed)
Pt scheduled for 09/14/17 @ 3pm with Dr. Drue NovelPaz

## 2017-09-02 NOTE — Telephone Encounter (Signed)
I realized that this was from a year ago. I only saw that Dr. Drue NovelPaz said it was okay? Should I leave the appt as is or do I need to call the patient back and let him know this was over a year ago and Drue Novelaz is no longer taking new patients? Please advise.

## 2017-09-10 DIAGNOSIS — J301 Allergic rhinitis due to pollen: Secondary | ICD-10-CM | POA: Diagnosis not present

## 2017-09-14 ENCOUNTER — Ambulatory Visit (INDEPENDENT_AMBULATORY_CARE_PROVIDER_SITE_OTHER): Payer: Commercial Managed Care - PPO | Admitting: Internal Medicine

## 2017-09-14 ENCOUNTER — Encounter: Payer: Self-pay | Admitting: Internal Medicine

## 2017-09-14 VITALS — BP 124/78 | HR 88 | Temp 98.1°F | Resp 16 | Ht 70.0 in | Wt 249.4 lb

## 2017-09-14 DIAGNOSIS — I351 Nonrheumatic aortic (valve) insufficiency: Secondary | ICD-10-CM

## 2017-09-14 DIAGNOSIS — Z114 Encounter for screening for human immunodeficiency virus [HIV]: Secondary | ICD-10-CM | POA: Diagnosis not present

## 2017-09-14 DIAGNOSIS — Z0000 Encounter for general adult medical examination without abnormal findings: Secondary | ICD-10-CM

## 2017-09-14 NOTE — Progress Notes (Signed)
Pre visit review using our clinic review tool, if applicable. No additional management support is needed unless otherwise documented below in the visit note. 

## 2017-09-14 NOTE — Progress Notes (Signed)
Subjective:    Patient ID: Frederick Hartman, male    DOB: 1980-10-08, 37 y.o.   MRN: [id number]  DOS:  09/14/2017 Type of visit - description : New patient, prefers a physician that speaks Spanish, CPX Interval history: At this point he is feeling well. Went to the ER last year, essentially asymptomatic since. , Wt Readings from Last 3 Encounters:  09/14/17 249 lb 6 oz (113.1 kg)  05/13/16 240 lb (108.9 kg)  04/21/16 241 lb (109.3 kg)     Review of Systems Did have some back pain lately, went to see a chiropractor, symptoms resolved. Some stress, work related, not a major issue.  Other than above, a 14 point review of systems is negative      Past Medical History:  Diagnosis Date  . Allergic rhinitis due to pollen   . Chronic allergic conjunctivitis   . Murmur   . Other allergic rhinitis    grasses, weeds, trees, apergillus (allergy immunotherapy)  . Severe aortic insufficiency 06/2014   Diastolic murmur: Transth echo showed mod/severe Aortic regurg, aortic valve not well visualized, transesoph echo recommended.  Otherwise the echo was normal.  Transesoph echo showed bicuspid AV.    Past Surgical History:  Procedure Laterality Date  . TRANSTHORACIC ECHOCARDIOGRAM  07/14/14; 20/1/18   2016: Mod/severe Aortic regurg.  EF 60-65%, normal LV size and wall motion.  Aortic valve not well visualized: transesoph echo to follow..  Repeat 04/2016 showed EF 60-65%, severe AI w/ bicuspid AV, grade 2 DD.    Social History   Socioeconomic History  . Marital status: Married    Spouse name: Not on file  . Number of children: Not on file  . Years of education: Not on file  . Highest education level: Not on file  Occupational History  . Occupation: Production designer, theatre/television/filmmanager   Social Needs  . Financial resource strain: Not on file  . Food insecurity:    Worry: Not on file    Inability: Not on file  . Transportation needs:    Medical: Not on file    Non-medical: Not on file  Tobacco Use   . Smoking status: Never Smoker  . Smokeless tobacco: Never Used  Substance and Sexual Activity  . Alcohol use: Yes    Alcohol/week: 0.6 oz    Types: 1 Glasses of wine per week    Comment: socially  . Drug use: No  . Sexual activity: Not on file  Lifestyle  . Physical activity:    Days per week: Not on file    Minutes per session: Not on file  . Stress: Not on file  Relationships  . Social connections:    Talks on phone: Not on file    Gets together: Not on file    Attends religious service: Not on file    Active member of club or organization: Not on file    Attends meetings of clubs or organizations: Not on file    Relationship status: Not on file  . Intimate partner violence:    Fear of current or ex partner: Not on file    Emotionally abused: Not on file    Physically abused: Not on file    Forced sexual activity: Not on file  Other Topics Concern  . Not on file  Social History Narrative   Married, male partner.   No children.   [place], [place], [place] since [place].   College: BA in Audiological scientistAccounting in [place].  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   No tob, occ wine.  No hx of alc/drug abuse.      Family History  Problem Relation Age of Onset  . Diabetes Father   . CAD Neg Hx   . Cancer Neg Hx      Allergies as of 09/14/2017   No Known Allergies     Medication List        Accurate as of 09/14/17 11:59 PM. Always use your most recent med list.          UNABLE TO FIND Med Name: Allergy injections once weekly          Objective:   Physical Exam BP 124/78 (BP Location: Left Arm, Patient Position: Sitting, Cuff Size: Normal)   Pulse 88   Temp 98.1 F (36.7 C) (Oral)   Resp 16   Ht 5\' 10"  (1.778 m)   Wt 249 lb 6 oz (113.1 kg)   SpO2 98%   BMI 35.78 kg/m  General: Well developed, NAD, see BMI.  Neck: No  thyromegaly  HEENT:  Normocephalic . Face symmetric, atraumatic Lungs:  CTA B Normal  respiratory effort, no intercostal retractions, no accessory muscle use. Heart: RRR, + systolic/diastolic murmur.  No pretibial edema bilaterally  Abdomen:  Not distended, soft, non-tender. No rebound or rigidity.   Skin: Exposed areas without rash. Not pale. Not jaundice Neurologic:  alert & oriented X3.  Speech normal, gait appropriate for age and unassisted Strength symmetric and appropriate for age.  Psych: Cognition and judgment appear intact.  Cooperative with normal attention span and concentration.  Behavior appropriate. No anxious or depressed appearing.     Assessment & Plan:    Assessment Aortic insufficiency DX to 16 Allergies, seen at the Allergy clinic on injections   PLAN: Aortic insufficiency: Seems to be doing well, asymptomatic, refer to cardiology for a non-urgent appointment, routine follow-up. RTC 1 year

## 2017-09-14 NOTE — Patient Instructions (Addendum)
GO TO THE FRONT DESK Schedule labs to be done tomorrow, fasting.   Schedule your next appointment for a physical exam in 1 year  exercise at least 3 hours a week  Consider using  An app called MYFITNESSPAL for calorie counting

## 2017-09-14 NOTE — Assessment & Plan Note (Signed)
-  Td 2019 --RTC fasting for : CMP, FLP, CBC, TSH, A1c, HIV --Exercise discussed.  Calorie counting?

## 2017-09-15 ENCOUNTER — Other Ambulatory Visit (INDEPENDENT_AMBULATORY_CARE_PROVIDER_SITE_OTHER): Payer: Commercial Managed Care - PPO

## 2017-09-15 DIAGNOSIS — Z114 Encounter for screening for human immunodeficiency virus [HIV]: Secondary | ICD-10-CM | POA: Diagnosis not present

## 2017-09-15 DIAGNOSIS — Z09 Encounter for follow-up examination after completed treatment for conditions other than malignant neoplasm: Secondary | ICD-10-CM | POA: Insufficient documentation

## 2017-09-15 DIAGNOSIS — Z0000 Encounter for general adult medical examination without abnormal findings: Secondary | ICD-10-CM | POA: Diagnosis not present

## 2017-09-15 LAB — CBC WITH DIFFERENTIAL/PLATELET
BASOS PCT: 0.4 % (ref 0.0–3.0)
Basophils Absolute: 0 10*3/uL (ref 0.0–0.1)
Eosinophils Absolute: 0.1 10*3/uL (ref 0.0–0.7)
Eosinophils Relative: 0.8 % (ref 0.0–5.0)
HEMATOCRIT: 47.1 % (ref 39.0–52.0)
HEMOGLOBIN: 15.7 g/dL (ref 13.0–17.0)
LYMPHS PCT: 30.2 % (ref 12.0–46.0)
Lymphs Abs: 2 10*3/uL (ref 0.7–4.0)
MCHC: 33.3 g/dL (ref 30.0–36.0)
MCV: 90.1 fl (ref 78.0–100.0)
MONOS PCT: 8 % (ref 3.0–12.0)
Monocytes Absolute: 0.5 10*3/uL (ref 0.1–1.0)
Neutro Abs: 4 10*3/uL (ref 1.4–7.7)
Neutrophils Relative %: 60.6 % (ref 43.0–77.0)
Platelets: 204 10*3/uL (ref 150.0–400.0)
RBC: 5.23 Mil/uL (ref 4.22–5.81)
RDW: 13.3 % (ref 11.5–15.5)
WBC: 6.6 10*3/uL (ref 4.0–10.5)

## 2017-09-15 LAB — COMPREHENSIVE METABOLIC PANEL
ALBUMIN: 4.2 g/dL (ref 3.5–5.2)
ALT: 32 U/L (ref 0–53)
AST: 20 U/L (ref 0–37)
Alkaline Phosphatase: 81 U/L (ref 39–117)
BUN: 14 mg/dL (ref 6–23)
CHLORIDE: 107 meq/L (ref 96–112)
CO2: 31 mEq/L (ref 19–32)
Calcium: 9.1 mg/dL (ref 8.4–10.5)
Creatinine, Ser: 0.82 mg/dL (ref 0.40–1.50)
GFR: 111.95 mL/min (ref >60.00)
Glucose, Bld: 104 mg/dL — ABNORMAL HIGH (ref 70–99)
POTASSIUM: 4.8 meq/L (ref 3.5–5.1)
SODIUM: 143 meq/L (ref 135–145)
Total Bilirubin: 0.6 mg/dL (ref 0.2–1.2)
Total Protein: 6.8 g/dL (ref 6.0–8.3)

## 2017-09-15 LAB — LIPID PANEL
CHOL/HDL RATIO: 5
CHOLESTEROL: 199 mg/dL (ref 0–200)
HDL: 36.5 mg/dL — ABNORMAL LOW (ref >39.00)
LDL CALC: 141 mg/dL — AB (ref 0–99)
NonHDL: 162.27
Triglycerides: 107 mg/dL (ref 0.0–149.0)
VLDL: 21.4 mg/dL (ref 0.0–40.0)

## 2017-09-15 LAB — HEMOGLOBIN A1C: Hgb A1c MFr Bld: 5.5 % (ref 4.6–6.5)

## 2017-09-15 LAB — TSH: TSH: 1.5 u[IU]/mL (ref 0.35–4.50)

## 2017-09-15 NOTE — Assessment & Plan Note (Signed)
Aortic insufficiency: Seems to be doing well, asymptomatic, refer to cardiology for a non-urgent appointment, routine follow-up. RTC 1 year

## 2017-09-16 LAB — HIV ANTIBODY (ROUTINE TESTING W REFLEX): HIV: NONREACTIVE

## 2017-09-21 ENCOUNTER — Telehealth: Payer: Self-pay | Admitting: Internal Medicine

## 2017-09-21 DIAGNOSIS — I351 Nonrheumatic aortic (valve) insufficiency: Secondary | ICD-10-CM

## 2017-09-21 NOTE — Telephone Encounter (Signed)
New message     Pt is stating that he needs a EKG ordered before his ref visit w/ Dr. Tenny Crawoss.

## 2017-09-21 NOTE — Telephone Encounter (Signed)
Pt states he gets his yearly echo during the same day as his OV. Pt is scheduled with Dr. Tenny Crawoss on 12/07/17. He is requesting an echo appt prior to his OV. Informed pt I will place order and our schedulers will call him to schedule appt. He stated understanding and thankful for the call.

## 2017-10-13 DIAGNOSIS — J301 Allergic rhinitis due to pollen: Secondary | ICD-10-CM | POA: Diagnosis not present

## 2017-10-21 DIAGNOSIS — J301 Allergic rhinitis due to pollen: Secondary | ICD-10-CM | POA: Diagnosis not present

## 2017-10-30 DIAGNOSIS — J301 Allergic rhinitis due to pollen: Secondary | ICD-10-CM | POA: Diagnosis not present

## 2017-11-23 ENCOUNTER — Encounter: Payer: Self-pay | Admitting: Internal Medicine

## 2017-11-25 ENCOUNTER — Ambulatory Visit (HOSPITAL_BASED_OUTPATIENT_CLINIC_OR_DEPARTMENT_OTHER)
Admission: RE | Admit: 2017-11-25 | Discharge: 2017-11-25 | Disposition: A | Discharge status: Home / Self Care | Payer: Commercial Managed Care - PPO | Source: Ambulatory Visit | Attending: Internal Medicine | Admitting: Internal Medicine

## 2017-11-25 ENCOUNTER — Ambulatory Visit: Payer: Self-pay | Admitting: *Deleted

## 2017-11-25 ENCOUNTER — Ambulatory Visit (INDEPENDENT_AMBULATORY_CARE_PROVIDER_SITE_OTHER): Payer: Commercial Managed Care - PPO | Admitting: Internal Medicine

## 2017-11-25 ENCOUNTER — Encounter: Payer: Self-pay | Admitting: Internal Medicine

## 2017-11-25 VITALS — BP 138/70 | HR 97 | Temp 98.3°F | Resp 16 | Ht 70.0 in | Wt 239.0 lb

## 2017-11-25 DIAGNOSIS — H8149 Vertigo of central origin, unspecified ear: Secondary | ICD-10-CM

## 2017-11-25 DIAGNOSIS — R1013 Epigastric pain: Secondary | ICD-10-CM | POA: Diagnosis not present

## 2017-11-25 DIAGNOSIS — R0789 Other chest pain: Secondary | ICD-10-CM | POA: Diagnosis not present

## 2017-11-25 DIAGNOSIS — H814 Vertigo of central origin: Secondary | ICD-10-CM

## 2017-11-25 DIAGNOSIS — R51 Headache: Secondary | ICD-10-CM | POA: Diagnosis not present

## 2017-11-25 LAB — D-DIMER, QUANTITATIVE: D-Dimer, Quant: 0.65 mcg/mL FEU — ABNORMAL HIGH (ref <0.50)

## 2017-11-25 NOTE — Telephone Encounter (Signed)
Pt called with complaints of left  Arm intermittent numbness which started 11/24/17; he said his tongue was tingling and numbness 11/24/17 and 11/25/17; in his right forearm pain for 11/24/17 and 11/25/17 days, and headaches and nausea since last week when he came home from [facility] returning home on 11/16/17; he is also having diarrhea, and his stool is green; he reports 5-6 episodes of having bowel movements today; the pt states that he did take a dose of pepto bismul while in [place];he is most concerned of the dizziness since 11/24/17; he reports that he is having a problem breathing "like he feels like he need to breathe deeply"; the pt says that he was having problems talking while he was at work (he was pausing too much); the pt also complains of chest pain 11/23/17 and 11/24/17; the pt states that he would like to see Dr Drue Novel because he speaks Spanish; recommendations made per nurse triage protocol; pt offered and accepted appointment with Dr Drue Novel, [facility]; today at 1540; he verbalizes undertanding   Reason for Disposition . [1] MODERATE dizziness (e.g., interferes with normal activities) AND [2] has NOT been evaluated by physician for this  (Exception: dizziness caused by heat exposure, sudden standing, or poor fluid intake)  Answer Assessment - Initial Assessment Questions 1. DESCRIPTION: "Describe your dizziness."     When getting up from chair, feels light headed sometimes  2. LIGHTHEADED: "Do you feel lightheaded?" (e.g., somewhat faint, woozy, weak upon standing)     upon standing 3. VERTIGO: "Do you feel like either you or the room is spinning or tilting?" (i.e. vertigo)     no 4. SEVERITY: "How bad is it?"  "Do you feel like you are going to faint?" "Can you stand and walk?"   - MILD - walking normally   - MODERATE - interferes with normal activities (e.g., work, school)    - SEVERE - unable to stand, requires support to walk, feels like passing out now.      mild to moderate 5. ONSET:  "When  did the dizziness begin?"     11/24/17 6. AGGRAVATING FACTORS: "Does anything make it worse?" (e.g., standing, change in head position)     standing 7. HEART RATE: "Can you tell me your heart rate?" "How many beats in 15 seconds?"  (Note: not all patients can do this)       109 per apple watch 8. CAUSE: "What do you think is causing the dizziness?"     Not sure 9. RECURRENT SYMPTOM: "Have you had dizziness before?" If so, ask: "When was the last time?" "What happened that time?"     Yes, was told that he was having a panic attack 10. OTHER SYMPTOMS: "Do you have any other symptoms?" (e.g., fever, chest pain, vomiting, diarrhea, bleeding)       Diarrhea, tingling in left arm, pain in right arm, tongue numbness and tingling, chest pain 11. PREGNANCY: "Is there any chance you are pregnant?" "When was your last menstrual period?"       n/a  Protocols used: DIZZINESS [facility]

## 2017-11-25 NOTE — Patient Instructions (Signed)
GO TO THE LAB : Get the blood work     GO TO THE FRONT DESK Schedule your next appointment for a checkup in 2 weeks  STOP BY THE FIRST FLOOR:   Get a CT of your head  Rest, drink plenty fluids  ER if: One of the labs came back positive (d-dimer) If you have symptoms again such as chest pain, dizziness, left-sided numbness, double vision, severe headache, etc.

## 2017-11-25 NOTE — Progress Notes (Signed)
Subjective:    Patient ID: Frederick Hartman, male    DOB: 1980/12/02, 37 y.o.   MRN: [id number]  DOS:  11/25/2017 Type of visit - description : acute Interval history: The patient went to [facility] from 11/12/2017 to 11/18/2017. 3 days into the trip, noted that the stools were green in color, he was having more frequent bowel movements but the stools were not loose or watery. He realized that he was over drinking and he also question the quality of the food. He then stopped alcohol and took some Pepto-Bismol. When he arrived to [facility], he took a stomach medication OTC, name?Marland Kitchen For several days he had increased frequency of BMs, greenish in color but for the last 2 days he is feeling better. Sister who went with him to [facility] had similar symptoms  He also has several other symptoms: 2 days ago, had left-sided upper chest pain that lasted for several hours.  It was associated with mild palpitations, questionable shortness of breath. sxs now is resolved.  Yesterday had a episode of dizziness, lasted few minutes, described as disequilibrium. He also said that he could not speak well for few minutes.  He thinks he had a panic episode. Denies diplopia. When asked, admits to numbness of the left face and left arm. Had a similar but less intense episode today in the morning.  Now asymptomatic.   Review of Systems No fever chills Has some nausea but no vomiting. Appetite has been normal throughout. No blood in the stools   Past Medical History:  Diagnosis Date  . Allergic rhinitis due to pollen   . Chronic allergic conjunctivitis   . Murmur   . Other allergic rhinitis    grasses, weeds, trees, apergillus (allergy immunotherapy)  . Severe aortic insufficiency 06/2014   Diastolic murmur: Transth echo showed mod/severe Aortic regurg, aortic valve not well visualized, transesoph echo recommended.  Otherwise the echo was normal.  Transesoph echo showed bicuspid AV.    Past  Surgical History:  Procedure Laterality Date  . TRANSTHORACIC ECHOCARDIOGRAM  07/14/14; 20/1/18   2016: Mod/severe Aortic regurg.  EF 60-65%, normal LV size and wall motion.  Aortic valve not well visualized: transesoph echo to follow..  Repeat 04/2016 showed EF 60-65%, severe AI w/ bicuspid AV, grade 2 DD.    Social History   Socioeconomic History  . Marital status: Married    Spouse name: Not on file  . Number of children: Not on file  . Years of education: Not on file  . Highest education level: Not on file  Occupational History  . Occupation: Production designer, theatre/television/film   Social Needs  . Financial resource strain: Not on file  . Food insecurity:    Worry: Not on file    Inability: Not on file  . Transportation needs:    Medical: Not on file    Non-medical: Not on file  Tobacco Use  . Smoking status: Never Smoker  . Smokeless tobacco: Never Used  Substance and Sexual Activity  . Alcohol use: Yes    Alcohol/week: 1.0 standard drinks    Types: 1 Glasses of wine per week    Comment: socially  . Drug use: No  . Sexual activity: Not on file  Lifestyle  . Physical activity:    Days per week: Not on file    Minutes per session: Not on file  . Stress: Not on file  Relationships  . Social connections:    Talks on phone: Not  on file    Gets together: Not on file    Attends religious service: Not on file    Active member of club or organization: Not on file    Attends meetings of clubs or organizations: Not on file    Relationship status: Not on file  . Intimate partner violence:    Fear of current or ex partner: Not on file    Emotionally abused: Not on file    Physically abused: Not on file    Forced sexual activity: Not on file  Other Topics Concern  . Not on file  Social History Narrative   Married, male partner.   No children.   Orig from [place], [place], [place] since 2014.   College: BA in Accounting in [place].    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   No tob, occ wine.  No hx of alc/drug abuse.       Allergies as of 11/25/2017   No Known Allergies     Medication List        Accurate as of 11/25/17 11:59 PM. Always use your most recent med list.          UNABLE TO FIND Med Name: Allergy injections once weekly          Objective:   Physical Exam BP 138/70 (BP Location: Left Arm, Patient Position: Sitting, Cuff Size: Large)   Pulse 97   Temp 98.3 F (36.8 C) (Oral)   Resp 16   Ht 5\' 10"  (1.778 m)   Wt 239 lb (108.4 kg)   SpO2 100%   BMI 34.29 kg/m  General:   Well developed, NAD, see BMI.  HEENT:  Normocephalic . Face symmetric, atraumatic Lungs:  CTA B Normal respiratory effort, no intercostal retractions, no accessory muscle use. Heart: RRR,  no murmur.  no pretibial edema bilaterally  Abdomen:  Not distended, soft, non-tender. No rebound or rigidity.   Skin: Not pale. Not jaundice Neurologic:  alert & oriented X3.  Speech normal, gait appropriate for age and unassisted. Motor symmetric.  DTR symmetric.  EOMI.  Pupils equal and reactive. Psych--  Cognition and judgment appear intact.  Cooperative with normal attention span and concentration.  Behavior appropriate. No anxious or depressed appearing.     Assessment & Plan:    Assessment Aortic insufficiency ,  bicusp aortic valve   Allergies, seen at the Allergy clinic on injections   PLAN: Dyspepsia: Characterized by  change in the color of the stools, increase frequency of bowel movements.  Now seems better. Multiple other symptoms:  Chest pain, dizziness, left face and left arm numbness, headache. All happening in the context of recent airplane trip and stomach problems.  The patient reports that he might have  not been drinking enough fluids.  He also has history of aortic insufficiency. Neuro exam non focal. EKG: Normal sinus rhythm.  No acute changes. Some of his symptoms are concerning consequently will proceed with: CBC, BMP,  stat d-dimer.  ER if d-dimer comes back (+), pt aware  CT head now ER if symptoms increase or resurface. Reassess in 2 weeks

## 2017-11-26 ENCOUNTER — Ambulatory Visit (HOSPITAL_BASED_OUTPATIENT_CLINIC_OR_DEPARTMENT_OTHER)
Admission: RE | Admit: 2017-11-26 | Discharge: 2017-11-26 | Disposition: A | Discharge status: Home / Self Care | Payer: Commercial Managed Care - PPO | Source: Ambulatory Visit | Attending: Internal Medicine | Admitting: Internal Medicine

## 2017-11-26 ENCOUNTER — Telehealth: Payer: Self-pay | Admitting: Internal Medicine

## 2017-11-26 ENCOUNTER — Encounter (HOSPITAL_BASED_OUTPATIENT_CLINIC_OR_DEPARTMENT_OTHER): Payer: Self-pay

## 2017-11-26 DIAGNOSIS — R0602 Shortness of breath: Secondary | ICD-10-CM

## 2017-11-26 DIAGNOSIS — R7989 Other specified abnormal findings of blood chemistry: Secondary | ICD-10-CM

## 2017-11-26 DIAGNOSIS — I517 Cardiomegaly: Secondary | ICD-10-CM | POA: Diagnosis not present

## 2017-11-26 DIAGNOSIS — K760 Fatty (change of) liver, not elsewhere classified: Secondary | ICD-10-CM | POA: Diagnosis not present

## 2017-11-26 LAB — CBC WITH DIFFERENTIAL/PLATELET
Basophils Absolute: 0.1 10*3/uL (ref 0.0–0.1)
Basophils Relative: 0.7 % (ref 0.0–3.0)
EOS ABS: 0 10*3/uL (ref 0.0–0.7)
Eosinophils Relative: 0.3 % (ref 0.0–5.0)
HCT: 46 % (ref 39.0–52.0)
HEMOGLOBIN: 15.6 g/dL (ref 13.0–17.0)
LYMPHS PCT: 24.9 % (ref 12.0–46.0)
Lymphs Abs: 2.1 10*3/uL (ref 0.7–4.0)
MCHC: 33.9 g/dL (ref 30.0–36.0)
MCV: 89.2 fl (ref 78.0–100.0)
MONO ABS: 0.6 10*3/uL (ref 0.1–1.0)
Monocytes Relative: 6.9 % (ref 3.0–12.0)
Neutro Abs: 5.7 10*3/uL (ref 1.4–7.7)
Neutrophils Relative %: 67.2 % (ref 43.0–77.0)
Platelets: 202 10*3/uL (ref 150.0–400.0)
RBC: 5.16 Mil/uL (ref 4.22–5.81)
RDW: 13 % (ref 11.5–15.5)
WBC: 8.4 10*3/uL (ref 4.0–10.5)

## 2017-11-26 LAB — BASIC METABOLIC PANEL
BUN: 17 mg/dL (ref 6–23)
CHLORIDE: 105 meq/L (ref 96–112)
CO2: 26 mEq/L (ref 19–32)
Calcium: 9.2 mg/dL (ref 8.4–10.5)
Creatinine, Ser: 0.88 mg/dL (ref 0.40–1.50)
GFR: 103.08 mL/min (ref >60.00)
Glucose, Bld: 97 mg/dL (ref 70–99)
POTASSIUM: 4.2 meq/L (ref 3.5–5.1)
SODIUM: 140 meq/L (ref 135–145)

## 2017-11-26 MED ORDER — IOPAMIDOL (ISOVUE-370) INJECTION 76%
100.0000 mL | Freq: Once | INTRAVENOUS | Status: AC | PRN
  Administered 2017-11-26: 100 mL via INTRAVENOUS

## 2017-11-26 NOTE — Telephone Encounter (Signed)
CTchest neg, pt notified

## 2017-11-26 NOTE — Telephone Encounter (Signed)
He had a head CT , now needs chest CT, please arrange stat

## 2017-11-26 NOTE — Telephone Encounter (Signed)
CT was done last night.

## 2017-11-26 NOTE — Telephone Encounter (Signed)
LMOM , d-dimer slightly positive, recommend CT chest to rule out a PE.  I asked patient to expect a phone call in regards to that.

## 2017-11-26 NOTE — Telephone Encounter (Signed)
Author phoned pt. to relay need for CT chest. Order already placed by Dr. Drue NovelPaz. Gwen, referral coordinator made aware for pre-certification as pt. Has [employer] insurance.

## 2017-11-26 NOTE — Assessment & Plan Note (Signed)
Dyspepsia: Characterized by  change in the color of the stools, increase frequency of bowel movements.  Now seems better. Multiple other symptoms:  Chest pain, dizziness, left face and left arm numbness, headache. All happening in the context of recent airplane trip and stomach problems.  The patient reports that he might have  not been drinking enough fluids.  He also has history of aortic insufficiency. Neuro exam non focal. EKG: Normal sinus rhythm.  No acute changes. Some of his symptoms are concerning consequently will proceed with: CBC, BMP, stat d-dimer.  ER if d-dimer comes back (+), pt aware  CT head now ER if symptoms increase or resurface. Reassess in 2 weeks

## 2017-11-26 NOTE — Telephone Encounter (Signed)
Patient possibly missed calls from imaging. Would liek to know where he is having it done so he may contact?

## 2017-11-27 ENCOUNTER — Telehealth: Payer: Self-pay

## 2017-12-01 DIAGNOSIS — J301 Allergic rhinitis due to pollen: Secondary | ICD-10-CM | POA: Diagnosis not present

## 2017-12-02 ENCOUNTER — Ambulatory Visit (INDEPENDENT_AMBULATORY_CARE_PROVIDER_SITE_OTHER): Payer: Commercial Managed Care - PPO | Admitting: Internal Medicine

## 2017-12-02 ENCOUNTER — Encounter: Payer: Self-pay | Admitting: Internal Medicine

## 2017-12-02 VITALS — BP 126/78 | HR 78 | Temp 98.8°F | Resp 16 | Ht 70.0 in | Wt 236.1 lb

## 2017-12-02 DIAGNOSIS — R42 Dizziness and giddiness: Secondary | ICD-10-CM

## 2017-12-02 DIAGNOSIS — R002 Palpitations: Secondary | ICD-10-CM | POA: Diagnosis not present

## 2017-12-02 NOTE — Progress Notes (Signed)
Pre visit review using our clinic review tool, if applicable. No additional management support is needed unless otherwise documented below in the visit note. 

## 2017-12-02 NOTE — Progress Notes (Signed)
Subjective:    Patient ID: Frederick Hartman, male    DOB: 1980-05-30, 37 y.o.   MRN: [id number]  DOS:  12/02/2017 Type of visit - description : f/u Interval history:  Since the last office visit, CT of the head came back negative, d-dimer was positive, follow-up CT chest showed no PE.  Here for follow-up. He is here for a early appointment because he continue with on and off symptoms: Occasional diarrhea, better with Kaopectate. Continue with episodes of chest pain, left-sided, associated with numbness at the left arm.  Also has numbness at the right arm sometimes. Continue with episodic dizziness and left face numbness.   Review of Systems He denies any syncope. Admits to a lot of stress Admits to occasional palpitations and difficulty breathing.   Past Medical History:  Diagnosis Date  . Allergic rhinitis due to pollen   . Chronic allergic conjunctivitis   . Murmur   . Other allergic rhinitis    grasses, weeds, trees, apergillus (allergy immunotherapy)  . Severe aortic insufficiency 06/2014   Diastolic murmur: Transth echo showed mod/severe Aortic regurg, aortic valve not well visualized, transesoph echo recommended.  Otherwise the echo was normal.  Transesoph echo showed bicuspid AV.    Past Surgical History:  Procedure Laterality Date  . TRANSTHORACIC ECHOCARDIOGRAM  07/14/14; 20/1/18   2016: Mod/severe Aortic regurg.  EF 60-65%, normal LV size and wall motion.  Aortic valve not well visualized: transesoph echo to follow..  Repeat 04/2016 showed EF 60-65%, severe AI w/ bicuspid AV, grade 2 DD.    Social History   Socioeconomic History  . Marital status: Married    Spouse name: Not on file  . Number of children: Not on file  . Years of education: Not on file  . Highest education level: Not on file  Occupational History  . Occupation: Production designer, theatre/television/film   Social Needs  . Financial resource strain: Not on file  . Food insecurity:    Worry: Not on file    Inability:  Not on file  . Transportation needs:    Medical: Not on file    Non-medical: Not on file  Tobacco Use  . Smoking status: Never Smoker  . Smokeless tobacco: Never Used  Substance and Sexual Activity  . Alcohol use: Yes    Alcohol/week: 1.0 standard drinks    Types: 1 Glasses of wine per week    Comment: socially  . Drug use: No  . Sexual activity: Not on file  Lifestyle  . Physical activity:    Days per week: Not on file    Minutes per session: Not on file  . Stress: Not on file  Relationships  . Social connections:    Talks on phone: Not on file    Gets together: Not on file    Attends religious service: Not on file    Active member of club or organization: Not on file    Attends meetings of clubs or organizations: Not on file    Relationship status: Not on file  . Intimate partner violence:    Fear of current or ex partner: Not on file    Emotionally abused: Not on file    Physically abused: Not on file    Forced sexual activity: Not on file  Other Topics Concern  . Not on file  Social History Narrative   Married, male partner.   No children.   Orig from [place], [place], [place] since 2014.   College: BA  in Accounting in [place].    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   No tob, occ wine.  No hx of alc/drug abuse.       Allergies as of 12/02/2017   No Known Allergies     Medication List        Accurate as of 12/02/17 11:59 PM. Always use your most recent med list.          UNABLE TO FIND Med Name: Allergy injections once weekly          Objective:   Physical Exam BP 126/78 (BP Location: Right Arm, Patient Position: Sitting, Cuff Size: Normal)   Pulse 78   Temp 98.8 F (37.1 C) (Oral)   Resp 16   Ht 5\' 10"  (1.778 m)   Wt 236 lb 2 oz (107.1 kg)   SpO2 98%   BMI 33.88 kg/m  General:   Well developed, NAD, see BMI.  HEENT:  Normocephalic . Face symmetric, atraumatic Lungs:  CTA B Normal respiratory  effort, no intercostal retractions, no accessory muscle use. Heart: RRR, soft systolic/diastolic murmur.  No pretibial edema bilaterally  Skin: Not pale. Not jaundice Neurologic:  alert & oriented X3.  Speech normal, gait appropriate for age and unassisted Psych--  Cognition and judgment appear intact.  Cooperative with normal attention span and concentration.  Behavior appropriate. No anxious or depressed appearing.      Assessment & Plan:   Assessment Aortic insufficiency ,  bicusp aortic valve   Allergies, seen at the Allergy clinic on injections  Fatty liver per CT 11-2017, incidental finding.  PLAN: Chest pain, dizziness, left face numbness, left arm numbness, occasional right arm numbness. Continue with multiple symptoms, work-up at the time of the last visit essentially negative. Etiology unclear.  He does have palpitations. Plan: Check a Holter, keep the appointment he has with cardiology soon. Refer to neurology reference numbness, dizziness. ER if symptoms severe or persistent.  RTC 09-2018 cpx

## 2017-12-03 ENCOUNTER — Telehealth: Payer: Self-pay | Admitting: Internal Medicine

## 2017-12-03 NOTE — Assessment & Plan Note (Signed)
Chest pain, dizziness, left face numbness, left arm numbness, occasional right arm numbness. Continue with multiple symptoms, work-up at the time of the last visit essentially negative. Etiology unclear.  He does have palpitations. Plan: Check a Holter, keep the appointment he has with cardiology soon. Refer to neurology reference numbness, dizziness. ER if symptoms severe or persistent.  RTC 09-2018 cpx

## 2017-12-03 NOTE — Telephone Encounter (Signed)
Patient came in and dropped off paperwork to be filled out by Dr. Drue NovelPaz. The form is for CPE verification for insurance. Patient would like to be called when it is ready so that he can pick it up.

## 2017-12-04 NOTE — Telephone Encounter (Signed)
Completed as much as possible; forwarded to provider/SLS 09/20

## 2017-12-07 ENCOUNTER — Ambulatory Visit (HOSPITAL_COMMUNITY): Payer: Commercial Managed Care - PPO | Attending: Internal Medicine

## 2017-12-07 ENCOUNTER — Encounter: Payer: Self-pay | Admitting: Internal Medicine

## 2017-12-07 ENCOUNTER — Ambulatory Visit (INDEPENDENT_AMBULATORY_CARE_PROVIDER_SITE_OTHER): Payer: Commercial Managed Care - PPO | Admitting: Internal Medicine

## 2017-12-07 ENCOUNTER — Other Ambulatory Visit: Payer: Self-pay

## 2017-12-07 VITALS — BP 118/58 | HR 93 | Ht 70.0 in | Wt 234.4 lb

## 2017-12-07 DIAGNOSIS — I082 Rheumatic disorders of both aortic and tricuspid valves: Secondary | ICD-10-CM | POA: Insufficient documentation

## 2017-12-07 DIAGNOSIS — E669 Obesity, unspecified: Secondary | ICD-10-CM | POA: Insufficient documentation

## 2017-12-07 DIAGNOSIS — I351 Nonrheumatic aortic (valve) insufficiency: Secondary | ICD-10-CM

## 2017-12-07 DIAGNOSIS — Z6833 Body mass index (BMI) 33.0-33.9, adult: Secondary | ICD-10-CM | POA: Insufficient documentation

## 2017-12-07 NOTE — Progress Notes (Signed)
Cardiology Office Note   Date:  12/07/2017   Frederick Hartman:  Frederick Hartman, Frederick CarolinaDOB 06/10/1980, MRN [id number]  PCP:  Wanda PlumpPaz, Jose E, MD  Cardiologist:   Dietrich PatesPaula Jaiya Mooradian, MD   F/U of aortic insufficiency    History of Present Illness: Frederick Hartman is a 37 y.o. male with a history of bicusp aortic valve and aortic insufficiency  I saw him in clinic  In Feb 2018  Since seen he has done well from a cardiac standpoint Relatively active   No dizziness  No CP   No SOB He did [place] [facility] [facility] [place]  Loose bowel movemnts   Just started ABX for this  No F/C        Current Meds  Medication Sig  . UNABLE TO FIND Med Name: Allergy injections once weekly     Allergies:   Patient has no known allergies.   Past Medical History:  Diagnosis Date  . Allergic rhinitis due to pollen   . Chronic allergic conjunctivitis   . Murmur   . Other allergic rhinitis    grasses, weeds, trees, apergillus (allergy immunotherapy)  . Severe aortic insufficiency 06/2014   Diastolic murmur: Transth echo showed mod/severe Aortic regurg, aortic valve not well visualized, transesoph echo recommended.  Otherwise the echo was normal.  Transesoph echo showed bicuspid AV.    Past Surgical History:  Procedure Laterality Date  . TRANSTHORACIC ECHOCARDIOGRAM  07/14/14; 20/1/18   2016: Mod/severe Aortic regurg.  EF 60-65%, normal LV size and wall motion.  Aortic valve not well visualized: transesoph echo to follow..  Repeat 04/2016 showed EF 60-65%, severe AI w/ bicuspid AV, grade 2 DD.     Social History:  The patient  reports that he has never smoked. He has never used smokeless tobacco. He reports that he drinks about 1.0 standard drinks of alcohol per week. He reports that he does not use drugs.   Family History:  The patient's family history includes Diabetes in his father.    ROS:  Please see the history of present illness. All other systems are reviewed and  Negative to the above problem  except as noted.    PHYSICAL EXAM: VS:  BP (!) 118/58   Pulse 93   Ht 5\' 10"  (1.778 m)   Wt 234 lb 6.4 oz (106.3 kg)   SpO2 97%   BMI 33.63 kg/m   GEN: Overweight 37 yo  in no acute distress  HEENT: normal  Neck: noJVP normal , carotid bruits, or masses Cardiac: RRR; Gr II/VI systolic murmur  Gr I-II/Vi diastolic murmur  rubs, or gallops,no edema  Respiratory:  clear to auscultation bilaterally, normal work of breathing GI: soft, nontender, nondistended, + BS  No hepatomegaly  MS: no deformity Moving all extremities   Skin: warm and dry, no rash Neuro:  Strength and sensation are intact Psych: euthymic mood, full affect   EKG:  EKG is not  ordered today.     Lipid Panel    Component Value Date/Time   CHOL 199 09/15/2017 0758   TRIG 107.0 09/15/2017 0758   HDL 36.50 (L) 09/15/2017 0758   CHOLHDL 5 09/15/2017 0758   VLDL 21.4 09/15/2017 0758   LDLCALC 141 (H) 09/15/2017 0758      Wt Readings from Last 3 Encounters:  12/07/17 234 lb 6.4 oz (106.3 kg)  12/02/17 236 lb 2 oz (107.1 kg)  11/25/17 239 lb (108.4 kg)  ASSESSMENT AND PLAN:  1  Aortic insufficiency  Echo done today AI is severe   CHambers are larger than on last echo  I will have the pt come back in 6 months for an echo   2  GI   Pt to try antibiotics   IF does not improve or develops f/, blood needs to see GI  Continue with activitis he is doing   No extreme wts    Current medicines are reviewed at length with the patient today.  The patient does not have concerns regarding medicines.  Signed, Dietrich Pates, MD  12/07/2017 3:59 PM    [facility] [address] [place], [place], [place]  [address] Phone: [phone]; Fax: [phone]

## 2017-12-07 NOTE — Patient Instructions (Signed)
Your physician recommends that you continue on your current medications as directed. Please refer to the Current Medication list given to you today. Your physician wants you to follow-up in: 6 months with Dr. Ross.  You will receive a reminder letter in the mail two months in advance. If you don't receive a letter, please call our office to schedule the follow-up appointment.  

## 2017-12-09 ENCOUNTER — Ambulatory Visit: Payer: Commercial Managed Care - PPO | Admitting: Internal Medicine

## 2017-12-11 ENCOUNTER — Other Ambulatory Visit: Payer: Self-pay

## 2017-12-11 DIAGNOSIS — J301 Allergic rhinitis due to pollen: Secondary | ICD-10-CM | POA: Diagnosis not present

## 2017-12-11 DIAGNOSIS — I351 Nonrheumatic aortic (valve) insufficiency: Secondary | ICD-10-CM

## 2017-12-15 DIAGNOSIS — J301 Allergic rhinitis due to pollen: Secondary | ICD-10-CM | POA: Diagnosis not present

## 2017-12-17 ENCOUNTER — Ambulatory Visit (INDEPENDENT_AMBULATORY_CARE_PROVIDER_SITE_OTHER): Payer: Commercial Managed Care - PPO

## 2017-12-17 ENCOUNTER — Other Ambulatory Visit: Payer: Self-pay | Admitting: Internal Medicine

## 2017-12-17 DIAGNOSIS — R0602 Shortness of breath: Secondary | ICD-10-CM

## 2017-12-17 DIAGNOSIS — R002 Palpitations: Secondary | ICD-10-CM

## 2017-12-25 DIAGNOSIS — J301 Allergic rhinitis due to pollen: Secondary | ICD-10-CM | POA: Diagnosis not present

## 2017-12-30 DIAGNOSIS — J301 Allergic rhinitis due to pollen: Secondary | ICD-10-CM | POA: Diagnosis not present

## 2018-01-07 DIAGNOSIS — J301 Allergic rhinitis due to pollen: Secondary | ICD-10-CM | POA: Diagnosis not present

## 2018-01-13 DIAGNOSIS — J301 Allergic rhinitis due to pollen: Secondary | ICD-10-CM | POA: Diagnosis not present

## 2018-01-21 DIAGNOSIS — J301 Allergic rhinitis due to pollen: Secondary | ICD-10-CM | POA: Diagnosis not present

## 2018-01-29 DIAGNOSIS — J301 Allergic rhinitis due to pollen: Secondary | ICD-10-CM | POA: Diagnosis not present

## 2018-02-05 DIAGNOSIS — J301 Allergic rhinitis due to pollen: Secondary | ICD-10-CM | POA: Diagnosis not present

## 2018-02-09 ENCOUNTER — Ambulatory Visit: Payer: Commercial Managed Care - PPO | Admitting: Neurology

## 2018-02-16 DIAGNOSIS — J301 Allergic rhinitis due to pollen: Secondary | ICD-10-CM | POA: Diagnosis not present

## 2018-02-22 DIAGNOSIS — J301 Allergic rhinitis due to pollen: Secondary | ICD-10-CM | POA: Diagnosis not present

## 2018-03-02 ENCOUNTER — Encounter: Payer: Self-pay | Admitting: Internal Medicine

## 2018-03-05 DIAGNOSIS — J301 Allergic rhinitis due to pollen: Secondary | ICD-10-CM | POA: Diagnosis not present

## 2018-03-26 NOTE — Telephone Encounter (Signed)
Error

## 2018-04-13 ENCOUNTER — Ambulatory Visit: Payer: Commercial Managed Care - PPO | Admitting: Neurology

## 2018-04-23 ENCOUNTER — Ambulatory Visit (HOSPITAL_COMMUNITY): Payer: Commercial Managed Care - PPO | Attending: Cardiology

## 2018-04-23 DIAGNOSIS — I351 Nonrheumatic aortic (valve) insufficiency: Secondary | ICD-10-CM | POA: Diagnosis not present

## 2018-09-20 ENCOUNTER — Encounter: Payer: Commercial Managed Care - PPO | Admitting: Internal Medicine

## 2018-10-11 ENCOUNTER — Encounter: Payer: Self-pay | Admitting: Internal Medicine

## 2019-12-31 IMAGING — CT CT ANGIO CHEST
2 of 8 series · 19 of 36 positions shown · IV contrast (iopamidol)
Comparison: Chest radiographs, 05/13/2016.

CLINICAL DATA: Two day history of chest pain, dyspnea sz and
shortness of breath. Also left facial and arm numbness.

EXAM:
CT ANGIOGRAPHY CHEST WITH CONTRAST
TECHNIQUE: Multidetector CT imaging of the chest was performed using the
standard protocol during bolus administration of intravenous
contrast. Multiplanar CT image reconstructions and MIPs were
obtained to evaluate the vascular anatomy.
CONTRAST:  100mL [id number] IOPAMIDOL ([id number]) INJECTION 76%

[Series 6: pe coronal mpr · coronal · 0.53mm/px · 1 of 139 slices shown]
[im 70/139  mediastinal]
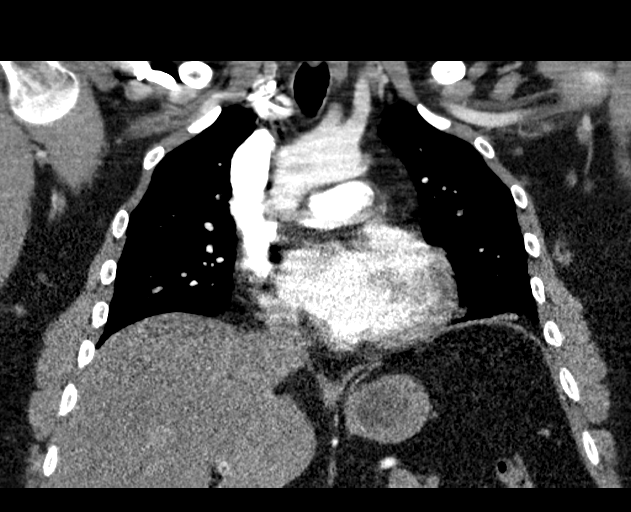

[Series 10: pe thins · axial · 0.70mm/px · z∈[-252,-31]mm · 18 of 249 slices shown]
[im 14/249  lung]
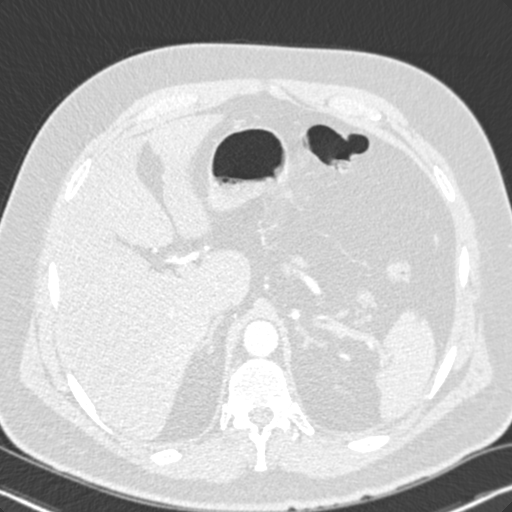
[im 27/249  mediastinal]
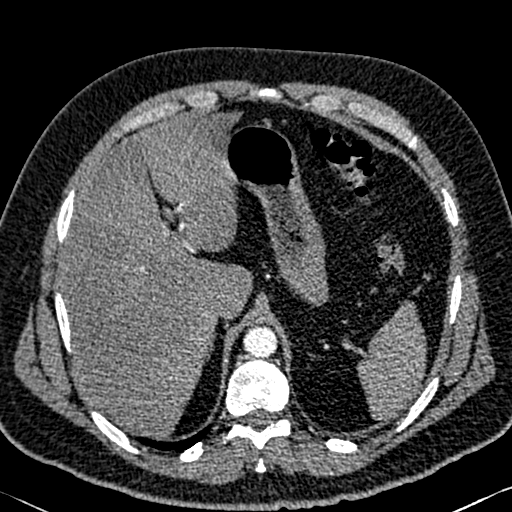
[im 40/249  lung]
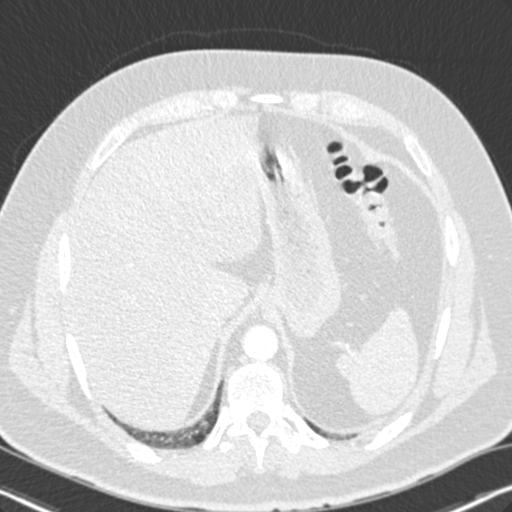
[im 53/249  mediastinal]
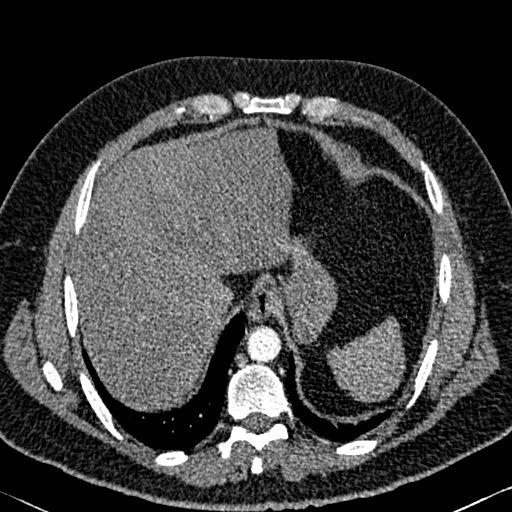
[im 66/249  lung]
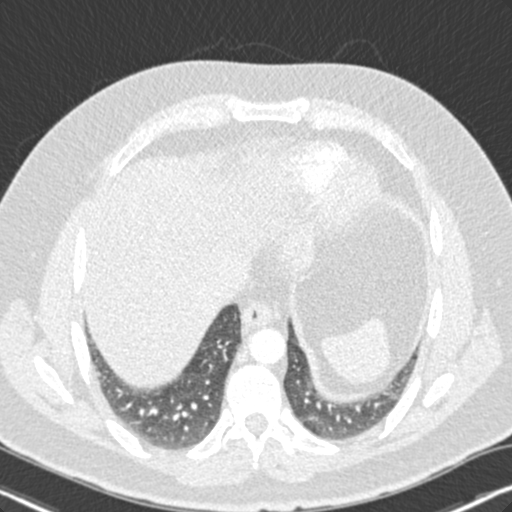
[im 79/249  mediastinal]
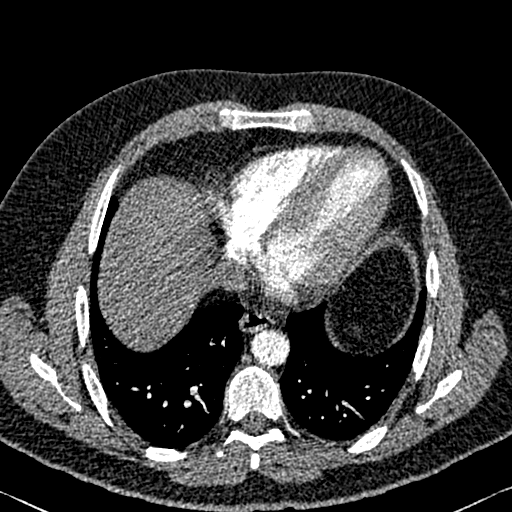
[im 92/249  lung]
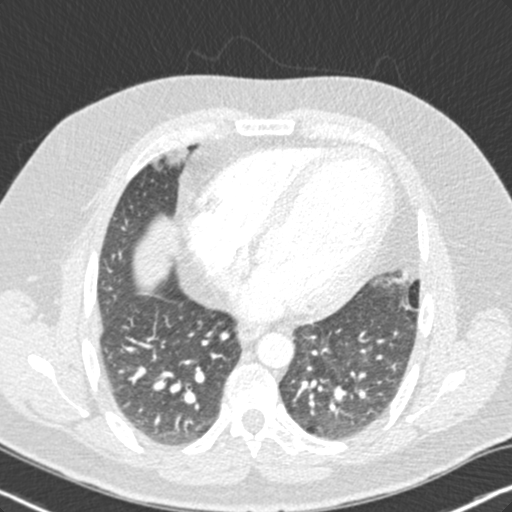
[im 105/249  mediastinal]
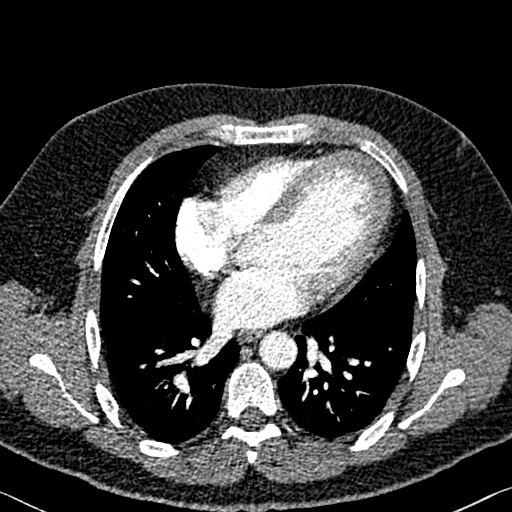
[im 118/249  lung]
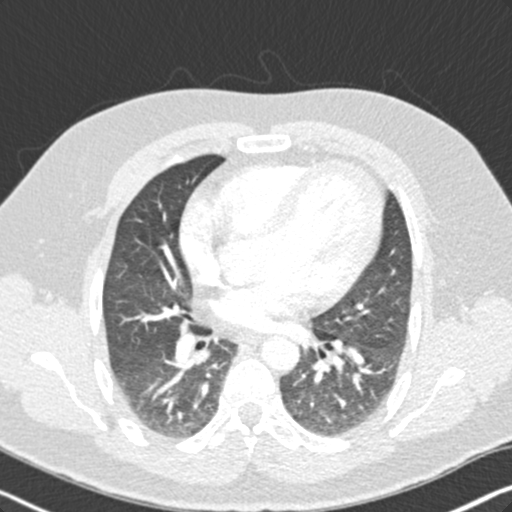
[im 131/249  mediastinal]
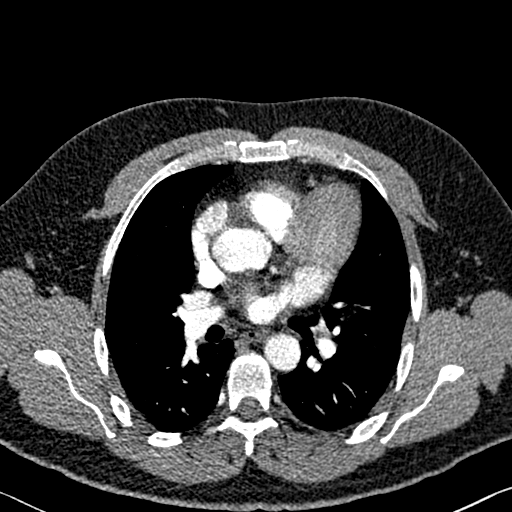
[im 144/249  lung]
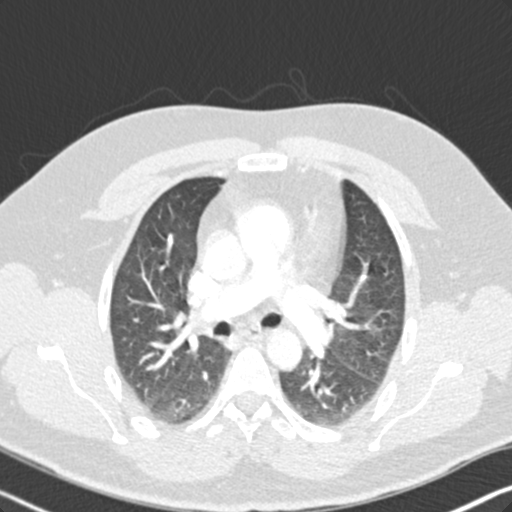
[im 157/249  mediastinal]
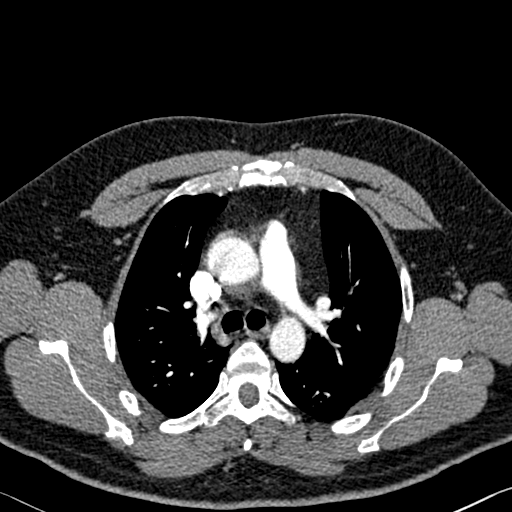
[im 170/249  lung]
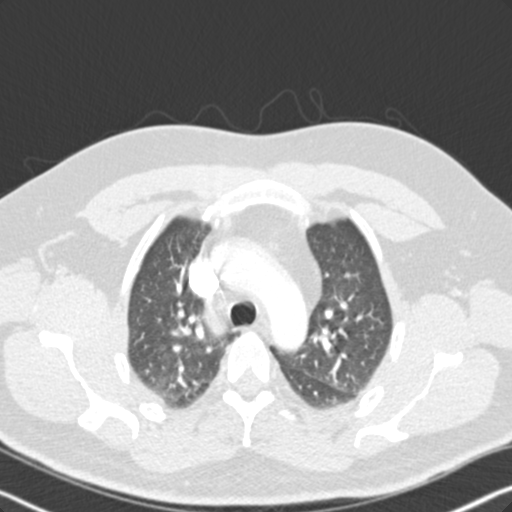
[im 183/249  mediastinal]
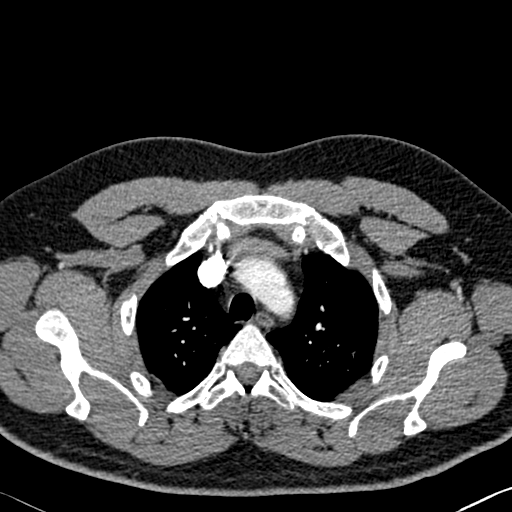
[im 196/249  lung]
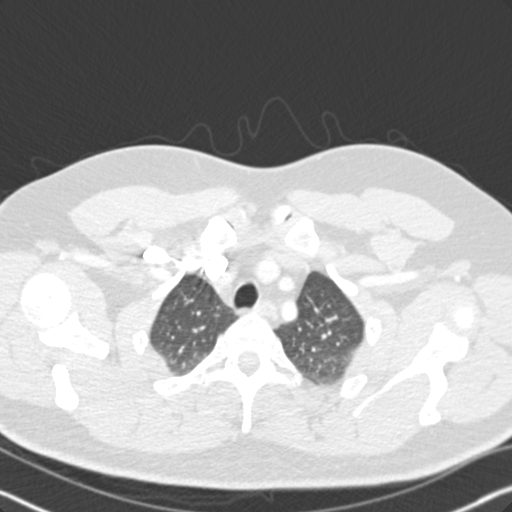
[im 209/249  mediastinal]
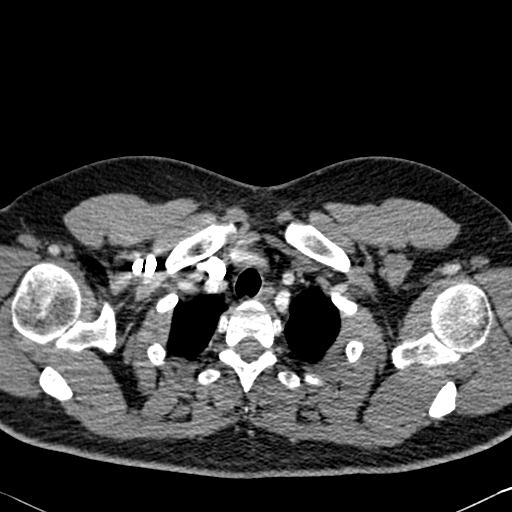
[im 222/249  lung]
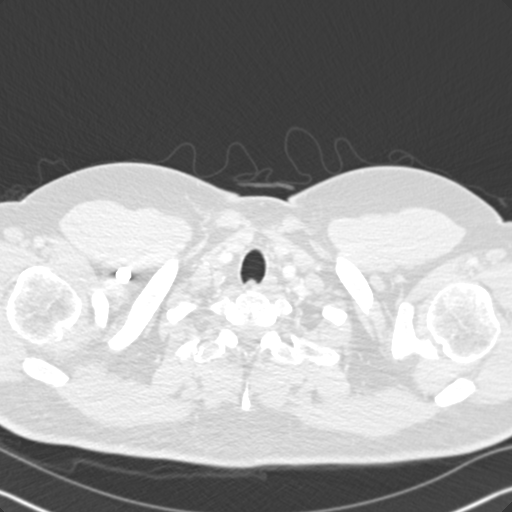
[im 235/249  mediastinal]
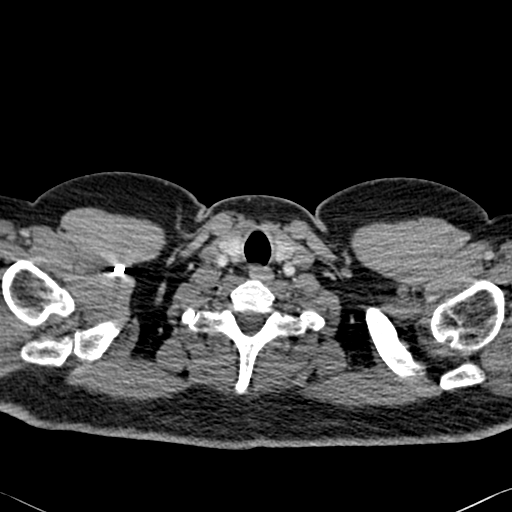

[19 of 36 positions shown; findings below may reference images not displayed]

FINDINGS: Cardiovascular: There is satisfactory opacification of the central
pulmonary arteries. Some contrast diffusion mildly limits assessment
of the smaller segmental and subsegmental pulmonary arteries for
pulmonary embolism. Allowing for this, there is no evidence of a
pulmonary embolism.

Heart is mildly enlarged. No pericardial effusion. No coronary
artery calcifications. Aorta is normal in caliber. No dissection or
atherosclerosis.

Mediastinum/Nodes: No enlarged mediastinal, hilar, or axillary lymph
nodes. Thyroid gland, trachea, and esophagus demonstrate no
significant findings.

Lungs/Pleura: Small emphysematous bullae at the lateral left lung
base. Minimal dependent subsegmental atelectasis. Lungs otherwise
clear. No pleural effusion or pneumothorax.

Upper Abdomen: No acute findings.  Fatty infiltration of the liver.

Musculoskeletal: No chest wall abnormality. No acute or significant
osseous findings.

Review of the MIP images confirms the above findings.
IMPRESSION: 1. No evidence of a pulmonary embolism.
2. Mild cardiomegaly.
3. No acute findings.  Lungs essentially clear.
4. Hepatic steatosis.

## 2020-06-20 ENCOUNTER — Encounter: Payer: Self-pay | Admitting: Internal Medicine

## 2020-12-06 NOTE — Progress Notes (Signed)
  Subjective:    Frederick Hartman - 40 y.o. male MRN [id number]  Date of birth: 12/23/1980  HPI  Frederick Hartman Janeece Agee is to establish care.  Current issues and/or concerns: None. Reports history of heart murmur and was seeing Cardiology in the past and considering returning.   ROS per HPI   Health Maintenance:  Health Maintenance Due  Topic Date Due   COVID-19 Vaccine (1) Never done   Hepatitis C Screening  Never done     Past Medical History: Patient Active Problem List   Diagnosis Date Noted   Annual physical exam 09/14/2017      Social History   reports that he has never smoked. He has never used smokeless tobacco. He reports current alcohol use of about 4.0 standard drinks per week. He reports that he does not use drugs.   Family History  family history includes Diabetes in his father.   Medications: reviewed and updated   Objective:   Physical Exam BP 121/73 (BP Location: Left Arm, Patient Position: Sitting, Cuff Size: Large)   Pulse 79   Temp 98.2 F (36.8 C)   Resp 18   Ht 5' 9.69" (1.77 m)   Wt 230 lb 3.2 oz (104.4 kg)   SpO2 95%   BMI 33.33 kg/m  Physical Exam HENT:     Head: Normocephalic and atraumatic.  Eyes:     Extraocular Movements: Extraocular movements intact.     Conjunctiva/sclera: Conjunctivae normal.     Pupils: Pupils are equal, round, and reactive to light.  Cardiovascular:     Rate and Rhythm: Normal rate and regular rhythm.     Pulses: Normal pulses.     Heart sounds: Normal heart sounds.  Pulmonary:     Effort: Pulmonary effort is normal.     Breath sounds: Normal breath sounds.  Musculoskeletal:     Cervical back: Normal range of motion and neck supple.  Neurological:     General: No focal deficit present.     Mental Status: He is alert and oriented to person, place, and time.  Psychiatric:        Mood and Affect: Mood normal.        Behavior: Behavior normal.       Assessment & Plan:  1. Encounter  to establish care: - Patient presents today to establish care.  - Return for annual physical examination, labs, and health maintenance. Arrive fasting meaning having no food for at least 8 hours prior to appointment. You may have only water or black coffee. Please take scheduled medications as normal.  2. Need for immunization against influenza: - Administered today in office.  - Flu Vaccine QUAD 51mo+IM (Fluarix, Fluzone & Alfiuria Quad PF)    Patient was given clear instructions to go to Emergency Department or return to medical center if symptoms don't improve, worsen, or new problems develop.The patient verbalized understanding.  I discussed the assessment and treatment plan with the patient. The patient was provided an opportunity to ask questions and all were answered. The patient agreed with the plan and demonstrated an understanding of the instructions.   The patient was advised to call back or seek an in-person evaluation if the symptoms worsen or if the condition fails to improve as anticipated.    Ricky Stabs, NP 12/10/2020, 10:52 AM Primary Care at [facility]

## 2020-12-10 ENCOUNTER — Ambulatory Visit (INDEPENDENT_AMBULATORY_CARE_PROVIDER_SITE_OTHER): Payer: 59 | Admitting: Family

## 2020-12-10 ENCOUNTER — Encounter: Payer: Self-pay | Admitting: Family

## 2020-12-10 ENCOUNTER — Other Ambulatory Visit: Payer: Self-pay

## 2020-12-10 VITALS — BP 121/73 | HR 79 | Temp 98.2°F | Resp 18 | Ht 69.69 in | Wt 230.2 lb

## 2020-12-10 DIAGNOSIS — Z7689 Persons encountering health services in other specified circumstances: Secondary | ICD-10-CM | POA: Diagnosis not present

## 2020-12-10 DIAGNOSIS — Z23 Encounter for immunization: Secondary | ICD-10-CM | POA: Diagnosis not present

## 2020-12-10 DIAGNOSIS — Z789 Other specified health status: Secondary | ICD-10-CM

## 2020-12-10 NOTE — Progress Notes (Signed)
Pt presents to establish care, denies pain, desires flu vaccination

## 2020-12-10 NOTE — Patient Instructions (Signed)

## 2021-01-11 NOTE — Progress Notes (Signed)
Patient ID: Frederick Hartman, male    DOB: 1981/01/31  MRN: [id number]  CC: Annual Physical Exam   Subjective: Frederick Hartman is a 40 y.o. male who presents for annual physical exam.   His concerns today include:  None   Patient Active Problem List   Diagnosis Date Noted   Annual physical exam 09/14/2017     No current outpatient medications on file prior to visit.   No current facility-administered medications on file prior to visit.    No Known Allergies  Social History   Socioeconomic History   Marital status: Married    Spouse name: Not on file   Number of children: Not on file   Years of education: Not on file   Highest education level: Not on file  Occupational History   Occupation: Freight forwarder   Tobacco Use   Smoking status: Never   Smokeless tobacco: Never  Vaping Use   Vaping Use: Never used  Substance and Sexual Activity   Alcohol use: Yes    Alcohol/week: 4.0 standard drinks    Types: 1 Glasses of wine, 3 Shots of liquor per week    Comment: socially   Drug use: No   Sexual activity: Not on file  Other Topics Concern   Not on file  Social History Narrative   Married, male partner.   No children.   Orig from [place], [place], [place] since 2014.   College: BA in [employer] in [place].    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   No tob, occ wine.  No hx of alc/drug abuse.    Social Determinants of Health   Financial Resource Strain: Not on file  Food Insecurity: Not on file  Transportation Needs: Not on file  Physical Activity: Not on file  Stress: Not on file  Social Connections: Not on file  Intimate Partner Violence: Not on file    Family History  Problem Relation Age of Onset   Diabetes Father    CAD Neg Hx    Cancer Neg Hx     Past Surgical History:  Procedure Laterality Date   TRANSTHORACIC ECHOCARDIOGRAM  07/14/14; 20/1/18   2016: Mod/severe Aortic regurg.  EF 60-65%,  normal LV size and wall motion.  Aortic valve not well visualized: transesoph echo to follow..  Repeat 04/2016 showed EF 60-65%, severe AI w/ bicuspid AV, grade 2 DD.    ROS: Review of Systems Negative except as stated above  PHYSICAL EXAM: BP 126/79 (BP Location: Left Arm, Patient Position: Sitting, Cuff Size: Large)   Pulse 86   Temp 98.3 F (36.8 C)   Resp 16   Ht 5' 9.69" (1.77 m)   Wt 231 lb 3.2 oz (104.9 kg)   SpO2 96%   BMI 33.47 kg/m   Physical Exam HENT:     Head: Normocephalic and atraumatic.     Right Ear: Tympanic membrane, ear canal and external ear normal.     Left Ear: Tympanic membrane, ear canal and external ear normal.  Eyes:     Extraocular Movements: Extraocular movements intact.     Conjunctiva/sclera: Conjunctivae normal.     Pupils: Pupils are equal, round, and reactive to light.  Cardiovascular:     Rate and Rhythm: Normal rate and regular rhythm.     Pulses: Normal pulses.     Heart sounds: Normal heart sounds.  Pulmonary:     Effort: Pulmonary effort is normal.  Breath sounds: Normal breath sounds.  Abdominal:     General: Bowel sounds are normal.     Palpations: Abdomen is soft.  Genitourinary:    Comments: Patient declined exam.  Musculoskeletal:        General: Normal range of motion.     Cervical back: Normal range of motion and neck supple.  Skin:    General: Skin is warm and dry.     Capillary Refill: Capillary refill takes less than 2 seconds.  Neurological:     General: No focal deficit present.     Mental Status: He is alert and oriented to person, place, and time.  Psychiatric:        Mood and Affect: Mood normal.        Behavior: Behavior normal.   ASSESSMENT AND PLAN: 1. Annual physical exam: - Counseled on 150 minutes of exercise per week as tolerated, healthy eating (including decreased daily intake of saturated fats, cholesterol, added sugars, sodium), STI prevention, and routine healthcare maintenance.  2. Screening  for metabolic disorder: - QBH41+PFXT to check kidney function, liver function, and electrolyte balance.  - CMP14+EGFR  3. Screening for deficiency anemia: - CBC to screen for anemia. - CBC  4. Diabetes mellitus screening: - Hemoglobin A1c to screen for pre-diabetes/diabetes. - Hemoglobin A1c  5. Screening cholesterol level: - Lipid panel to screen for high cholesterol.  - Lipid panel  6. Thyroid disorder screen: - TSH to check thyroid function.  - TSH  7. Need for hepatitis C screening test: - Hepatitis C antibody to screen for hepatitis C.  - Hepatitis C Antibody  8. Screening for STD (sexually transmitted disease): - Screening for gonorrhea, chlamydia, and trichomonas.  - Urine cytology ancillary only   Patient was given the opportunity to ask questions.  Patient verbalized understanding of the plan and was able to repeat key elements of the plan. Patient was given clear instructions to go to Emergency Department or return to medical center if symptoms don't improve, worsen, or new problems develop.The patient verbalized understanding.   Orders Placed This Encounter  Procedures   Hepatitis C Antibody   CMP14+EGFR   CBC   Lipid panel   TSH   Hemoglobin A1c     Return in about 1 year (around 01/14/2022) for Physical per patient preference.  Camillia Herter, NP

## 2021-01-14 ENCOUNTER — Other Ambulatory Visit: Payer: Self-pay

## 2021-01-14 ENCOUNTER — Other Ambulatory Visit (HOSPITAL_COMMUNITY)
Admission: RE | Admit: 2021-01-14 | Discharge: 2021-01-14 | Disposition: A | Discharge status: Home / Self Care | Payer: 59 | Source: Ambulatory Visit | Attending: Family | Admitting: Family

## 2021-01-14 ENCOUNTER — Encounter: Payer: Self-pay | Admitting: Family

## 2021-01-14 ENCOUNTER — Ambulatory Visit (INDEPENDENT_AMBULATORY_CARE_PROVIDER_SITE_OTHER): Payer: 59 | Admitting: Family

## 2021-01-14 VITALS — BP 126/79 | HR 86 | Temp 98.3°F | Resp 16 | Ht 69.69 in | Wt 231.2 lb

## 2021-01-14 DIAGNOSIS — Z0000 Encounter for general adult medical examination without abnormal findings: Secondary | ICD-10-CM

## 2021-01-14 DIAGNOSIS — Z13228 Encounter for screening for other metabolic disorders: Secondary | ICD-10-CM

## 2021-01-14 DIAGNOSIS — Z113 Encounter for screening for infections with a predominantly sexual mode of transmission: Secondary | ICD-10-CM | POA: Diagnosis present

## 2021-01-14 DIAGNOSIS — Z13220 Encounter for screening for lipoid disorders: Secondary | ICD-10-CM

## 2021-01-14 DIAGNOSIS — Z1329 Encounter for screening for other suspected endocrine disorder: Secondary | ICD-10-CM

## 2021-01-14 DIAGNOSIS — Z130 Encounter for screening for diseases of the blood and blood-forming organs and certain disorders involving the immune mechanism: Secondary | ICD-10-CM

## 2021-01-14 DIAGNOSIS — Z131 Encounter for screening for diabetes mellitus: Secondary | ICD-10-CM

## 2021-01-14 DIAGNOSIS — Z1159 Encounter for screening for other viral diseases: Secondary | ICD-10-CM

## 2021-01-14 NOTE — Patient Instructions (Signed)
Preventive Care 40-41 Years Old, Male Preventive care refers to lifestyle choices and visits with your health care provider that can promote health and wellness. This includes: A yearly physical exam. This is also called an annual wellness visit. Regular dental and eye exams. Immunizations. Screening for certain conditions. Healthy lifestyle choices, such as: Eating a healthy diet. Getting regular exercise. Not using drugs or products that contain nicotine and tobacco. Limiting alcohol use. What can I expect for my preventive care visit? Physical exam Your health care provider will check your: Height and weight. These may be used to calculate your BMI (body mass index). BMI is a measurement that tells if you are at a healthy weight. Heart rate and blood pressure. Body temperature. Skin for abnormal spots. Counseling Your health care provider may ask you questions about your: Past medical problems. Family's medical history. Alcohol, tobacco, and drug use. Emotional well-being. Home life and relationship well-being. Sexual activity. Diet, exercise, and sleep habits. Work and work environment. Access to firearms. What immunizations do I need? Vaccines are usually given at various ages, according to a schedule. Your health care provider will recommend vaccines for you based on your age, medical history, and lifestyle or other factors, such as travel or where you work. What tests do I need? Blood tests Lipid and cholesterol levels. These may be checked every 5 years, or more often if you are over 50 years old. Hepatitis C test. Hepatitis B test. Screening Lung cancer screening. You may have this screening every year starting at age 55 if you have a 30-pack-year history of smoking and currently smoke or have quit within the past 15 years. Prostate cancer screening. Recommendations will vary depending on your family history and other risks. Genital exam to check for testicular cancer  or hernias. Colorectal cancer screening. All adults should have this screening starting at age 50 and continuing until age 75. Your health care provider may recommend screening at age 45 if you are at increased risk. You will have tests every 1-10 years, depending on your results and the type of screening test. Diabetes screening. This is done by checking your blood sugar (glucose) after you have not eaten for a while (fasting). You may have this done every 1-3 years. STD (sexually transmitted disease) testing, if you are at risk. Follow these instructions at home: Eating and drinking  Eat a diet that includes fresh fruits and vegetables, whole grains, lean protein, and low-fat dairy products. Take vitamin and mineral supplements as recommended by your health care provider. Do not drink alcohol if your health care provider tells you not to drink. If you drink alcohol: Limit how much you have to 0-2 drinks a day. Be aware of how much alcohol is in your drink. In the [place] one drink equals one 12 oz bottle of beer (355 mL), one 5 oz glass of wine (148 mL), or one 1 oz glass of hard liquor (44 mL). Lifestyle Take daily care of your teeth and gums. Brush your teeth every morning and night with fluoride toothpaste. Floss one time each day. Stay active. Exercise for at least 30 minutes 5 or more days each week. Do not use any products that contain nicotine or tobacco, such as cigarettes, e-cigarettes, and chewing tobacco. If you need help quitting, ask your health care provider. Do not use drugs. If you are sexually active, practice safe sex. Use a condom or other form of protection to prevent STIs (sexually transmitted infections). If told by your   health care provider, take low-dose aspirin daily starting at age 50. Find healthy ways to cope with stress, such as: Meditation, yoga, or listening to music. Journaling. Talking to a trusted person. Spending time with friends and  family. Safety Always wear your seat belt while driving or riding in a vehicle. Do not drive: If you have been drinking alcohol. Do not ride with someone who has been drinking. When you are tired or distracted. While texting. Wear a helmet and other protective equipment during sports activities. If you have firearms in your house, make sure you follow all gun safety procedures. What's next? Go to your health care provider once a year for an annual wellness visit. Ask your health care provider how often you should have your eyes and teeth checked. Stay up to date on all vaccines. This information is not intended to replace advice given to you by your health care provider. Make sure you discuss any questions you have with your health care provider. Document Revised: 05/11/2020 Document Reviewed: 02/25/2018 Elsevier Patient Education  2022 Elsevier Inc.   

## 2021-01-14 NOTE — Progress Notes (Signed)
Pt presents for annual physical exam, desires STD testing no other concerns

## 2021-01-15 LAB — CBC
Hematocrit: 49.7 % (ref 37.5–51.0)
Hemoglobin: 16.2 g/dL (ref 13.0–17.7)
MCH: 29.7 pg (ref 26.6–33.0)
MCHC: 32.6 g/dL (ref 31.5–35.7)
MCV: 91 fL (ref 79–97)
Platelets: 188 10*3/uL (ref 150–450)
RBC: 5.45 x10E6/uL (ref 4.14–5.80)
RDW: 12.2 % (ref 11.6–15.4)
WBC: 5.7 10*3/uL (ref 3.4–10.8)

## 2021-01-15 LAB — CMP14+EGFR
ALT: 21 IU/L (ref 0–44)
AST: 22 IU/L (ref 0–40)
Albumin/Globulin Ratio: 1.8 (ref 1.2–2.2)
Albumin: 4.4 g/dL (ref 4.0–5.0)
Alkaline Phosphatase: 74 IU/L (ref 44–121)
BUN/Creatinine Ratio: 18 (ref 9–20)
BUN: 14 mg/dL (ref 6–24)
Bilirubin Total: 0.4 mg/dL (ref 0.0–1.2)
CO2: 23 mmol/L (ref 20–29)
Calcium: 9 mg/dL (ref 8.7–10.2)
Chloride: 106 mmol/L (ref 96–106)
Creatinine, Ser: 0.76 mg/dL (ref 0.76–1.27)
Globulin, Total: 2.5 g/dL (ref 1.5–4.5)
Glucose: 96 mg/dL (ref 70–99)
Potassium: 4.4 mmol/L (ref 3.5–5.2)
Sodium: 142 mmol/L (ref 134–144)
Total Protein: 6.9 g/dL (ref 6.0–8.5)
eGFR: 116 mL/min/{1.73_m2} (ref >59)

## 2021-01-15 LAB — HEPATITIS C ANTIBODY: Hep C Virus Ab: <0.1 s/co ratio (ref 0.0–0.9)

## 2021-01-15 LAB — LIPID PANEL
Chol/HDL Ratio: 5.3 ratio — ABNORMAL HIGH (ref 0.0–5.0)
Cholesterol, Total: 196 mg/dL (ref 100–199)
HDL: 37 mg/dL — ABNORMAL LOW (ref >39)
LDL Chol Calc (NIH): 140 mg/dL — ABNORMAL HIGH (ref 0–99)
Triglycerides: 106 mg/dL (ref 0–149)
VLDL Cholesterol Cal: 19 mg/dL (ref 5–40)

## 2021-01-15 LAB — TSH: TSH: 0.92 u[IU]/mL (ref 0.450–4.500)

## 2021-01-15 LAB — HEMOGLOBIN A1C
Est. average glucose Bld gHb Est-mCnc: 103 mg/dL
Hgb A1c MFr Bld: 5.2 % (ref 4.8–5.6)

## 2021-01-15 NOTE — Progress Notes (Signed)
Kidney function normal.   Liver function normal.   Thyroid function normal.   No diabetes.   No anemia.   Hepatitis C negative.  Cholesterol higher than expected. High cholesterol may increase risk of heart attack and/or stroke. Consider eating more fruits, vegetables, and lean baked meats such as chicken or fish. Moderate intensity exercise at least 150 minutes as tolerated per week may help as well. No medication needed at the moment. Encouraged to recheck cholesterol at lab only appointment in 3 to 6 months.   The following is for provider reference only: The 10-year ASCVD risk score (Arnett DK, et al., 2019) is: 1.8%   Values used to calculate the score:     Age: 39 years     Sex: Male     Is Non-Hispanic African American: No     Diabetic: No     Tobacco smoker: No     Systolic Blood Pressure: 126 mmHg     Is BP treated: No     HDL Cholesterol: 37 mg/dL     Total Cholesterol: 196 mg/dL

## 2021-01-16 LAB — URINE CYTOLOGY ANCILLARY ONLY
Bacterial Vaginitis-Urine: NEGATIVE
Candida Urine: NEGATIVE
Chlamydia: NEGATIVE
Comment: NEGATIVE
Comment: NEGATIVE
Comment: NORMAL
Neisseria Gonorrhea: NEGATIVE
Trichomonas: NEGATIVE

## 2021-01-16 NOTE — Progress Notes (Signed)
Gonorrhea, Chlamydia, and Trichomonas negative.

## 2021-09-05 ENCOUNTER — Encounter: Payer: Self-pay | Admitting: Family

## 2021-12-25 ENCOUNTER — Telehealth: Payer: Self-pay | Admitting: Internal Medicine

## 2021-12-25 DIAGNOSIS — I351 Nonrheumatic aortic (valve) insufficiency: Secondary | ICD-10-CM

## 2021-12-25 NOTE — Telephone Encounter (Signed)
Patient would like an order for an Echo, so he can have it done prior to his appt.

## 2021-12-25 NOTE — Telephone Encounter (Signed)
Please set up for echo prior to my visit with him

## 2021-12-25 NOTE — Telephone Encounter (Signed)
Pt will re-establish care with Dr. Harrington Challenger on 01/29/22.  Last seen 2019.  He is asking if Dr. Harrington Challenger would order for him to get an echo done prior to this appt?  Last echo on file is from 04/2018.  Will route this message to Dr. Harrington Challenger and RN to further review and advise on, upon return to the office.   Either Dr. Alan Ripper RN or a triage nurse to follow-up with the pt accordingly thereafter, with further recommendations.

## 2021-12-26 NOTE — Telephone Encounter (Signed)
Echo order placed.

## 2022-01-06 NOTE — Progress Notes (Signed)
Patient ID: Frederick Hartman, male    DOB: 1980/10/06  MRN: [id number]  CC: Annual Physical Exam   Subjective: Frederick Hartman is a 41 y.o. male who presents for annual physical exam.   His concerns today include:  - Endorses skin tags of left anterior shoulder and penis. Reports he uses a device to remove the skin tags of his left shoulder and they usually grow back. Size has increased over time. Denies pain and additional red flag symptoms of skin tags. I discussed with patient he should see a dermatologist for evaluation and management. Patient declines presently. He is aware to notify if he decides to see a dermatologist in the future.  - Endorses intermittent right breast pain and can feel a nodule sometimes if scratching. Denies additional symptoms.  - He is established with Cardiology for management of aortic valve insufficiency, nonrheumatic aortic valve insufficiency, mitral regurgitation, and heart murmur. Next appointment 01/27/2022 and 01/29/2022.   Patient Active Problem List   Diagnosis Date Noted   Annual physical exam 09/14/2017     No current outpatient medications on file prior to visit.   No current facility-administered medications on file prior to visit.    No Known Allergies  Social History   Socioeconomic History   Marital status: Married    Spouse name: Not on file   Number of children: Not on file   Years of education: Not on file   Highest education level: Not on file  Occupational History   Occupation: Freight forwarder   Tobacco Use   Smoking status: Never    Passive exposure: Never   Smokeless tobacco: Never  Vaping Use   Vaping Use: Never used  Substance and Sexual Activity   Alcohol use: Yes    Alcohol/week: 4.0 standard drinks of alcohol    Types: 1 Glasses of wine, 3 Shots of liquor per week    Comment: socially   Drug use: No   Sexual activity: Yes  Other Topics Concern   Not on file  Social History Narrative    Married, male partner.   No children.   Orig from [place], [place], [place] since 2014.   College: BA in [place] in [place].    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   No tob, occ wine.  No hx of alc/drug abuse.    Social Determinants of Health   Financial Resource Strain: Not on file  Food Insecurity: Not on file  Transportation Needs: Not on file  Physical Activity: Not on file  Stress: Not on file  Social Connections: Not on file  Intimate Partner Violence: Not on file    Family History  Problem Relation Age of Onset   Diabetes Father    CAD Neg Hx    Cancer Neg Hx     Past Surgical History:  Procedure Laterality Date   TRANSTHORACIC ECHOCARDIOGRAM  07/14/14; 20/1/18   2016: Mod/severe Aortic regurg.  EF 60-65%, normal LV size and wall motion.  Aortic valve not well visualized: transesoph echo to follow..  Repeat 04/2016 showed EF 60-65%, severe AI w/ bicuspid AV, grade 2 DD.    ROS: Review of Systems Negative except as stated above  PHYSICAL EXAM: BP 119/70 (BP Location: Left Arm, Patient Position: Sitting, Cuff Size: Large)   Pulse 85   Temp 98.3 F (36.8 C)   Resp 16   Wt 240 lb (108.9 kg)   SpO2 95%   BMI 34.75  kg/m   Physical Exam HENT:     Head: Normocephalic and atraumatic.     Right Ear: Tympanic membrane, ear canal and external ear normal.     Left Ear: Tympanic membrane, ear canal and external ear normal.     Nose: Nose normal.     Mouth/Throat:     Mouth: Mucous membranes are moist.     Pharynx: Oropharynx is clear.  Eyes:     Extraocular Movements: Extraocular movements intact.     Conjunctiva/sclera: Conjunctivae normal.     Pupils: Pupils are equal, round, and reactive to light.  Cardiovascular:     Rate and Rhythm: Normal rate and regular rhythm.     Pulses: Normal pulses.     Heart sounds: Normal heart sounds.  Pulmonary:     Effort: Pulmonary effort is normal.     Breath sounds: Normal breath  sounds.  Chest:  Breasts:    Right: Normal.     Left: Normal.  Abdominal:     General: Bowel sounds are normal.     Palpations: Abdomen is soft.  Genitourinary:    Comments: Patient declined.  Musculoskeletal:     Right shoulder: Normal.     Left shoulder: Normal.     Right upper arm: Normal.     Left upper arm: Normal.     Right elbow: Normal.     Left elbow: Normal.     Right forearm: Normal.     Left forearm: Normal.     Right wrist: Normal.     Left wrist: Normal.     Right hand: Normal.     Left hand: Normal.     Cervical back: Normal, normal range of motion and neck supple.     Thoracic back: Normal.     Lumbar back: Normal.     Right hip: Normal.     Left hip: Normal.     Right upper leg: Normal.     Left upper leg: Normal.     Right knee: Normal.     Left knee: Normal.     Right lower leg: Normal.     Left lower leg: Normal.     Right ankle: Normal.     Left ankle: Normal.     Right foot: Normal.     Left foot: Normal.  Skin:    General: Skin is warm and dry.     Capillary Refill: Capillary refill takes less than 2 seconds.     Comments: Several skin tags left anterior shoulder. No drainage.   Neurological:     General: No focal deficit present.     Mental Status: He is alert and oriented to person, place, and time.  Psychiatric:        Mood and Affect: Mood normal.        Behavior: Behavior normal.     ASSESSMENT AND PLAN: 1. Annual physical exam - Counseled on 150 minutes of exercise per week as tolerated, healthy eating (including decreased daily intake of saturated fats, cholesterol, added sugars, sodium), STI prevention, and routine healthcare maintenance.  2. Screening for metabolic disorder - Routine screening.  - CMP14+EGFR  3. Screening for deficiency anemia - Routine screening.  - CBC  4. Diabetes mellitus screening - Routine screening.  - Hemoglobin A1c  5. Screening cholesterol level - Routine screening.  - Lipid panel  6.  Thyroid disorder screen - Routine screening.  - TSH  7. Breast pain in male - Mammogram for further evaluation.  -  MM Digital Screening; Future  8. Skin tag - Patient declined referral to Dermatology.   9. Need for HPV vaccination - Administered.  - HPV 9-valent vaccine,Recombinat  10. Need for immunization against influenza - Administered.  - Flu Vaccine QUAD 70moIM (Fluarix, Fluzone & Alfiuria Quad PF)   Patient was given the opportunity to ask questions.  Patient verbalized understanding of the plan and was able to repeat key elements of the plan. Patient was given clear instructions to go to Emergency Department or return to medical center if symptoms don't improve, worsen, or new problems develop.The patient verbalized understanding.   Orders Placed This Encounter  Procedures   MM Digital Screening   HPV 9-valent vaccine,Recombinat   Flu Vaccine QUAD 637moM (Fluarix, Fluzone & Alfiuria Quad PF)   CBC   Lipid panel   TSH   CMP14+EGFR   Hemoglobin A1c     Return in about 1 year (around 01/15/2023) for Physical per patient preference.  AmCamillia HerterNP

## 2022-01-14 ENCOUNTER — Ambulatory Visit (INDEPENDENT_AMBULATORY_CARE_PROVIDER_SITE_OTHER): Payer: Commercial Managed Care - PPO | Admitting: Family

## 2022-01-14 ENCOUNTER — Encounter: Payer: Self-pay | Admitting: Family

## 2022-01-14 VITALS — BP 119/70 | HR 85 | Temp 98.3°F | Resp 16 | Wt 240.0 lb

## 2022-01-14 DIAGNOSIS — Z23 Encounter for immunization: Secondary | ICD-10-CM

## 2022-01-14 DIAGNOSIS — Z1329 Encounter for screening for other suspected endocrine disorder: Secondary | ICD-10-CM

## 2022-01-14 DIAGNOSIS — Z0001 Encounter for general adult medical examination with abnormal findings: Secondary | ICD-10-CM | POA: Diagnosis not present

## 2022-01-14 DIAGNOSIS — Z13228 Encounter for screening for other metabolic disorders: Secondary | ICD-10-CM

## 2022-01-14 DIAGNOSIS — Z13220 Encounter for screening for lipoid disorders: Secondary | ICD-10-CM | POA: Diagnosis not present

## 2022-01-14 DIAGNOSIS — N644 Mastodynia: Secondary | ICD-10-CM | POA: Diagnosis not present

## 2022-01-14 DIAGNOSIS — Z131 Encounter for screening for diabetes mellitus: Secondary | ICD-10-CM | POA: Diagnosis not present

## 2022-01-14 DIAGNOSIS — Z130 Encounter for screening for diseases of the blood and blood-forming organs and certain disorders involving the immune mechanism: Secondary | ICD-10-CM

## 2022-01-14 DIAGNOSIS — Z789 Other specified health status: Secondary | ICD-10-CM

## 2022-01-14 DIAGNOSIS — L918 Other hypertrophic disorders of the skin: Secondary | ICD-10-CM

## 2022-01-14 DIAGNOSIS — Z0000 Encounter for general adult medical examination without abnormal findings: Secondary | ICD-10-CM

## 2022-01-14 NOTE — Progress Notes (Signed)
.  Pt presents for annual physical exam  

## 2022-01-14 NOTE — Patient Instructions (Signed)
Cuidados preventivos en los hombres de 42 a 65 aos de edad Preventive Care 6-42 Years Old, Male Los cuidados preventivos hacen referencia a las opciones en cuanto al estilo de vida y a las visitas al mdico, las cuales pueden promover la salud y Musician. Las visitas de cuidado preventivo tambin se denominan exmenes de Therapist, sports. Qu puedo esperar para mi visita de cuidado preventivo? Asesoramiento Durante la visita de cuidado preventivo, el mdico puede preguntarle sobre lo siguiente: Antecedentes mdicos, incluidos los siguientes: Problemas mdicos pasados. Antecedentes mdicos familiares. Salud actual, incluido lo siguiente: Su bienestar emocional. Restaurant manager, fast food y las relaciones personales. Su actividad sexual. Estilo de vida, incluido lo siguiente: Consumo de alcohol, nicotina, tabaco o drogas. Acceso a armas de fuego. Hbitos de alimentacin, ejercicio y sueo. Cuestiones de seguridad, como el uso de cinturn de seguridad y casco de Secondary school teacher. Uso de pantalla solar. Su trabajo y [place] laboral. Examen fsico El mdico revisar lo siguiente: IT consultant y [place]. Estos pueden usarse para calcular el IMC (ndice de masa corporal). El [facility] es una medicin que indica si tiene un peso saludable. Circunferencia de la cintura. Es Ardelia Mems medicin alrededor de Science writer. Esta medicin tambin indica si tiene un peso saludable y puede ayudar a predecir su riesgo de padecer ciertas enfermedades, como diabetes tipo 2 y presin arterial alta. Frecuencia cardaca y presin arterial. Temperatura corporal. Piel para detectar manchas anormales. Qu vacunas necesito?  Las vacunas se aplican a varias edades, segn un cronograma. El [employer] recomendar vacunas segn su edad, sus antecedentes mdicos, su estilo de vida y otros factores, como los viajes o el lugar donde trabaja. Qu pruebas necesito? Pruebas de deteccin El mdico puede recomendar pruebas de deteccin de ciertas  afecciones. Esto puede incluir: Niveles de lpidos y colesterol. Pruebas de deteccin de la diabetes. Esto se Set designer un control del azcar en la sangre (glucosa) despus de no haber comido durante un periodo de tiempo (ayuno). Prueba de hepatitis B. Prueba de hepatitis C. Prueba del VIH (virus de inmunodeficiencia humana). Pruebas de infecciones de transmisin sexual (ITS), si est en riesgo. Pruebas de deteccin de cncer de pulmn. Examen de deteccin del cncer de prstata. Pruebas de deteccin de Surveyor, minerals. Hable con su mdico sobre los [employer], las opciones de tratamiento y, si corresponde, la necesidad de Optometrist ms pruebas. Siga estas instrucciones en su casa: Comida y bebida  Siga una dieta que incluya frutas y verduras frescas, cereales integrales, protenas magras y productos lcteos descremados. Tome los suplementos vitamnicos y [employer] se lo haya indicado el mdico. No beba alcohol si el mdico se lo prohbe. Si bebe alcohol: Limite la cantidad que consume de 0 a 2 medidas por da. Sepa cunta cantidad de alcohol hay en las bebidas que toma. En los [place], una medida equivale a una botella de cerveza de 12 oz (355 ml), un vaso de vino de 5 oz (148 ml) o un vaso de una bebida alcohlica de alta graduacin de 1 oz (44 ml). Estilo de [employer] dientes a la maana y a la noche con Social research officer, government con fluoruro. Use hilo dental una vez al da. Haga al menos 30 minutos de ejercicio, 5 o ms das cada semana. No consuma ningn producto que contenga nicotina o tabaco. Estos productos incluyen cigarrillos, tabaco para Higher education careers adviser y aparatos de vapeo, como los Psychologist, sport and exercise. Si necesita ayuda para dejar de fumar, consulte al mdico. No consuma drogas. Si es sexualmente [place],  practique sexo seguro. Use un condn u otra forma de proteccin para prevenir las infecciones de transmisin sexual (ITS). Tome aspirina nicamente  como se lo haya indicado el mdico. Asegrese de que comprende qu cantidad y cul presentacin debe tomar. Trabaje con el mdico para averiguar si es seguro y beneficioso para usted tomar aspirina a diario. Busque maneras saludables de controlar el estrs, tales como: Meditacin, yoga o escuchar msica. Lleve un diario personal. Hable con una persona confiable. Pase tiempo con amigos y familiares. Minimice la exposicin a la radiacin UV para reducir el riesgo de cncer de piel. Seguridad Usa siempre el cinturn de seguridad al conducir o viajar en un vehculo. No conduzca: Si ha estado bebiendo alcohol. No viaje con un conductor que ha estado bebiendo. Si est cansado o distrado. Mientras est enviando mensajes de texto. Si ha estado usando sustancias o drogas que alteran la funcin mental. Use un casco y otros equipos de proteccin durante las actividades deportivas. Si tiene armas de fuego en su casa, asegrese de seguir todos los procedimientos de seguridad correspondientes. Cundo volver? Acuda al mdico una vez al ao para una visita anual de control de bienestar. Pregntele al mdico con qu frecuencia debe realizarse un control de la vista y los dientes. Mantenga su esquema de vacunacin al da. Esta informacin no tiene como fin reemplazar el consejo del mdico. Asegrese de hacerle al mdico cualquier pregunta que tenga. Document Revised: 09/20/2020 Document Reviewed: 09/20/2020 Elsevier Patient Education  2023 Elsevier Inc.  

## 2022-01-15 LAB — CBC
Hematocrit: 46.6 % (ref 37.5–51.0)
Hemoglobin: 15.5 g/dL (ref 13.0–17.7)
MCH: 30.5 pg (ref 26.6–33.0)
MCHC: 33.3 g/dL (ref 31.5–35.7)
MCV: 92 fL (ref 79–97)
Platelets: 202 10*3/uL (ref 150–450)
RBC: 5.08 x10E6/uL (ref 4.14–5.80)
RDW: 12.6 % (ref 11.6–15.4)
WBC: 5.8 10*3/uL (ref 3.4–10.8)

## 2022-01-15 LAB — LIPID PANEL
Chol/HDL Ratio: 4.7 ratio (ref 0.0–5.0)
Cholesterol, Total: 197 mg/dL (ref 100–199)
HDL: 42 mg/dL (ref >39)
LDL Chol Calc (NIH): 132 mg/dL — ABNORMAL HIGH (ref 0–99)
Triglycerides: 126 mg/dL (ref 0–149)
VLDL Cholesterol Cal: 23 mg/dL (ref 5–40)

## 2022-01-15 LAB — HEMOGLOBIN A1C
Est. average glucose Bld gHb Est-mCnc: 111 mg/dL
Hgb A1c MFr Bld: 5.5 % (ref 4.8–5.6)

## 2022-01-15 LAB — CMP14+EGFR
ALT: 31 IU/L (ref 0–44)
AST: 23 IU/L (ref 0–40)
Albumin/Globulin Ratio: 1.6 (ref 1.2–2.2)
Albumin: 4.2 g/dL (ref 4.1–5.1)
Alkaline Phosphatase: 88 IU/L (ref 44–121)
BUN/Creatinine Ratio: 18 (ref 9–20)
BUN: 14 mg/dL (ref 6–24)
Bilirubin Total: 0.4 mg/dL (ref 0.0–1.2)
CO2: 20 mmol/L (ref 20–29)
Calcium: 8.9 mg/dL (ref 8.7–10.2)
Chloride: 105 mmol/L (ref 96–106)
Creatinine, Ser: 0.79 mg/dL (ref 0.76–1.27)
Globulin, Total: 2.6 g/dL (ref 1.5–4.5)
Glucose: 86 mg/dL (ref 70–99)
Potassium: 4.3 mmol/L (ref 3.5–5.2)
Sodium: 141 mmol/L (ref 134–144)
Total Protein: 6.8 g/dL (ref 6.0–8.5)
eGFR: 114 mL/min/{1.73_m2} (ref >59)

## 2022-01-15 LAB — TSH: TSH: 1.82 u[IU]/mL (ref 0.450–4.500)

## 2022-01-27 ENCOUNTER — Ambulatory Visit (HOSPITAL_COMMUNITY): Payer: Commercial Managed Care - PPO | Attending: Internal Medicine

## 2022-01-27 DIAGNOSIS — I351 Nonrheumatic aortic (valve) insufficiency: Secondary | ICD-10-CM | POA: Insufficient documentation

## 2022-01-27 LAB — ECHOCARDIOGRAM COMPLETE
Area-P 1/2: 3.63 cm2
P 1/2 time: 283 msec
S' Lateral: 3.6 cm

## 2022-01-29 ENCOUNTER — Ambulatory Visit: Payer: Commercial Managed Care - PPO | Attending: Internal Medicine | Admitting: Internal Medicine

## 2022-01-29 ENCOUNTER — Encounter: Payer: Self-pay | Admitting: Internal Medicine

## 2022-01-29 VITALS — BP 138/68 | HR 83 | Ht 69.5 in | Wt 249.0 lb

## 2022-01-29 DIAGNOSIS — Z01810 Encounter for preprocedural cardiovascular examination: Secondary | ICD-10-CM

## 2022-01-29 DIAGNOSIS — I351 Nonrheumatic aortic (valve) insufficiency: Secondary | ICD-10-CM | POA: Diagnosis not present

## 2022-01-29 NOTE — Progress Notes (Signed)
Cardiology Office Note   Date:  01/29/2022   ID:  Frederick Hartman, [place] 1980/12/02, MRN [id number]  PCP:  Camillia Herter, NP  Cardiologist:   Dorris Carnes, MD   F/U of aortic insufficiency    History of Present Illness: Frederick Hartman is a 41 y.o. male with a history of probable  bicusp aortic valve and aortic insufficiency  I saw him in clinic in 2019  Echo at that time showed LVEF normal  Severe AI   I recomm f/u in 6 months   He had no insurance      Now he does He denies CP  Breathgin is OK  No dizziness   No palpitations Just had echo done a couple days agoa  Diet    He is now fasting 17 hrs per day   Eats lunch at 12 and dinner by 7     Lunch  Papoosa (tortilla, chicken, luttuce, tomato, cheese)   water Morning   Decaf   no sugar    Green tea Dinner   Pepper, beef,  No outpatient medications have been marked as taking for the 01/29/22 encounter (Office Visit) with Fay Records, MD.     Allergies:   Patient has no known allergies.   Past Medical History:  Diagnosis Date   Allergic rhinitis due to pollen    Aortic valve insufficiency    Chest pain    Chronic allergic conjunctivitis    Dizziness    Dyspepsia    Mitral regurgitation    Murmur    Other allergic rhinitis    grasses, weeds, trees, apergillus (allergy immunotherapy)   Severe aortic insufficiency [id number]   Diastolic murmur: Transth echo showed mod/severe Aortic regurg, aortic valve not well visualized, transesoph echo recommended.  Otherwise the echo was normal.  Transesoph echo showed bicuspid AV.   Vertigo     Past Surgical History:  Procedure Laterality Date   TRANSTHORACIC ECHOCARDIOGRAM  07/14/14; 20/1/18   2016: Mod/severe Aortic regurg.  EF 60-65%, normal LV size and wall motion.  Aortic valve not well visualized: transesoph echo to follow..  Repeat 04/2016 showed EF 60-65%, severe AI w/ bicuspid AV, grade 2 DD.     Social History:  The patient  reports that he has  never smoked. He has never been exposed to tobacco smoke. He has never used smokeless tobacco. He reports current alcohol use of about 4.0 standard drinks of alcohol per week. He reports that he does not use drugs.   Family History:  The patient's family history includes Diabetes in his father.    ROS:  Please see the history of present illness. All other systems are reviewed and  Negative to the above problem except as noted.    PHYSICAL EXAM: VS:  BP 138/68   Pulse 83   Ht 5' 9.5" (1.765 m)   Wt 249 lb (112.9 kg)   SpO2 97%   BMI 36.24 kg/m   GEN: Overweight 41 yo  in no acute distress  HEENT: normal  Neck: noJVP normal , carotid bruits, or masses Cardiac: RRR; Gr I/VI systolic murmur base  Gr I-II/Vi diastolic murmur  No LE edema  Respiratory:  clear to auscultation bilaterally, normal work of breathing GI: soft, nontender, nondistended, + BS  No hepatomegaly  MS: no deformity Moving all extremities   Skin: warm and dry, no rash Neuro:  Strength and sensation are intact Psych: euthymic mood, full affect  EKG:  EKG is  ordered today.    NSR   77 bpm  LVH   Lipid Panel    Component Value Date/Time   CHOL 197 01/14/2022 1012   TRIG 126 01/14/2022 1012   HDL 42 01/14/2022 1012   CHOLHDL 4.7 01/14/2022 1012   CHOLHDL 5 09/15/2017 0758   VLDL 21.4 09/15/2017 0758   LDLCALC 132 (H) 01/14/2022 1012      Wt Readings from Last 3 Encounters:  01/29/22 249 lb (112.9 kg)  01/14/22 240 lb (108.9 kg)  01/14/21 231 lb 3.2 oz (104.9 kg)      ASSESSMENT AND PLAN:  1  Aortic insufficiency  Echo a couple days ago shows LVEF normal   Severe AI   AV is thickened, difficult to see leaflets     I would recomm TEE to confirm valve morphology   Follow for now     Current medicines are reviewed at length with the patient today.  The patient does not have concerns regarding medicines.  Signed, Dietrich Pates, MD  01/29/2022 3:20 PM    [facility] [address] [place], [place], [place]  [address] Phone: [phone]; Fax: [phone]

## 2022-01-29 NOTE — Patient Instructions (Signed)
Medication Instructions:   *If you need a refill on your cardiac medications before your next appointment, please call your pharmacy*   Lab Work: Bmet and cbc prior to your TEE with in a week  If you have labs (blood work) drawn today and your tests are completely normal, you will receive your results only by: MyChart Message (if you have MyChart) OR A paper copy in the mail If you have any lab test that is abnormal or we need to change your treatment, we will call you to review the results.   Testing/Procedures: Your physician has requested that you have a TEE. During a TEE, sound waves are used to create images of your heart. It provides your doctor with information about the size and shape of your heart and how well your heart's chambers and valves are working. In this test, a transducer is attached to the end of a flexible tube that's guided down your throat and into your esophagus (the tube leading from you mouth to your stomach) to get a more detailed image of your heart. You are not awake for the procedure. Please see the instruction sheet given to you today. For further information please visit [email or web address]     Follow-Up: At [facility], you and your health needs are our priority.  As part of our continuing mission to provide you with exceptional heart care, we have created designated Provider Care Teams.  These Care Teams include your primary Cardiologist (physician) and Advanced Practice Providers (APPs -  Physician Assistants and Nurse Practitioners) who all work together to provide you with the care you need, when you need it.  We recommend signing up for the patient portal called "MyChart".  Sign up information is provided on this After Visit Summary.  MyChart is used to connect with patients for Virtual Visits (Telemedicine).  Patients are able to view lab/test results, encounter notes, upcoming appointments, etc.  Non-urgent messages can be sent to your  provider as well.   To learn more about what you can do with MyChart, go to [email or web address].      Important Information About Sugar

## 2022-02-13 ENCOUNTER — Ambulatory Visit (INDEPENDENT_AMBULATORY_CARE_PROVIDER_SITE_OTHER): Payer: Commercial Managed Care - PPO

## 2022-02-13 DIAGNOSIS — Z23 Encounter for immunization: Secondary | ICD-10-CM

## 2022-02-13 NOTE — Progress Notes (Signed)
HPV vaccination administered

## 2022-03-20 ENCOUNTER — Telehealth: Payer: Self-pay

## 2022-03-20 NOTE — Telephone Encounter (Signed)
-----   Message from Ciro Backer sent at 03/19/2022 12:32 PM EST ----- Regarding: RE: 1/05 Good afternoon,  Prior Josem Kaufmann is on file for the TEE. He's good to schedule.   Thanks so much,  Estill Bamberg  ----- Message ----- From: Ciro Backer Sent: 03/19/2022   8:38 AM EST To: Ciro Backer Subject: 1/05                                            ----- Message ----- From: Ciro Backer Sent: 03/19/2022  12:00 AM EST To: Ciro Backer Subject: 01/05                                           ----- Message ----- From: Stephani Police, RN Sent: 01/29/2022   4:02 PM EST To: Stephani Police, RN; # Subject: TEE                                            Pt to have a TEE for Aortic valve insufficiency.... he asked to have it precerted before he would let me schedule to have it done in January 2024 with Dr Harrington Challenger. Can you let me know... sorry but thank you!!! Traci Gafford B.

## 2022-03-20 NOTE — Telephone Encounter (Signed)
I called and spoke with the pt to advised him that the TEE has been precerted and he reports that he will send me a My Chart or call me back and let me know if he is ready top proceed. He says he will let me now later today.   I will forward to Dr Harrington Challenger to be sure she is still wanting to order at this time.

## 2022-04-07 NOTE — Telephone Encounter (Signed)
I spoke with Dr Harrington Challenger and we can put ina recall for the pt to see her early this Fall since no reponse from the pt for a possible TEE at this time.

## 2022-04-10 NOTE — Telephone Encounter (Signed)
Schedule when convenient for him and when he has insurance coverage

## 2022-04-22 NOTE — Progress Notes (Signed)
Pt called endoscopy dept stating he needs to cancel his TEE scheduled for 04/30/22; says [place] will be [place] of the [place] and will call the office to reschedule.

## 2022-04-30 ENCOUNTER — Encounter (HOSPITAL_COMMUNITY): Payer: Self-pay

## 2022-04-30 ENCOUNTER — Ambulatory Visit (HOSPITAL_COMMUNITY): Admit: 2022-04-30 | Payer: Commercial Managed Care - PPO | Admitting: Cardiology

## 2022-04-30 DIAGNOSIS — Z0000 Encounter for general adult medical examination without abnormal findings: Secondary | ICD-10-CM

## 2022-04-30 DIAGNOSIS — I340 Nonrheumatic mitral (valve) insufficiency: Secondary | ICD-10-CM

## 2022-04-30 SURGERY — ECHOCARDIOGRAM, TRANSESOPHAGEAL
Anesthesia: Moderate Sedation

## 2022-07-09 ENCOUNTER — Encounter: Payer: Self-pay | Admitting: Family

## 2022-07-15 ENCOUNTER — Ambulatory Visit (INDEPENDENT_AMBULATORY_CARE_PROVIDER_SITE_OTHER): Payer: Commercial Managed Care - PPO

## 2022-07-15 DIAGNOSIS — Z23 Encounter for immunization: Secondary | ICD-10-CM

## 2023-02-09 ENCOUNTER — Ambulatory Visit (INDEPENDENT_AMBULATORY_CARE_PROVIDER_SITE_OTHER): Payer: Commercial Managed Care - PPO | Admitting: Family Medicine

## 2023-02-09 VITALS — BP 138/73 | HR 97 | Temp 98.8°F | Resp 16 | Ht 69.65 in | Wt 259.4 lb

## 2023-02-09 DIAGNOSIS — E66812 Obesity, class 2: Secondary | ICD-10-CM

## 2023-02-09 DIAGNOSIS — Z130 Encounter for screening for diseases of the blood and blood-forming organs and certain disorders involving the immune mechanism: Secondary | ICD-10-CM

## 2023-02-09 DIAGNOSIS — E6609 Other obesity due to excess calories: Secondary | ICD-10-CM | POA: Diagnosis not present

## 2023-02-09 DIAGNOSIS — Z13220 Encounter for screening for lipoid disorders: Secondary | ICD-10-CM

## 2023-02-09 DIAGNOSIS — Z0000 Encounter for general adult medical examination without abnormal findings: Secondary | ICD-10-CM

## 2023-02-09 DIAGNOSIS — Z0001 Encounter for general adult medical examination with abnormal findings: Secondary | ICD-10-CM | POA: Diagnosis not present

## 2023-02-09 DIAGNOSIS — Z6837 Body mass index (BMI) 37.0-37.9, adult: Secondary | ICD-10-CM | POA: Diagnosis not present

## 2023-02-09 MED ORDER — PHENTERMINE HCL 37.5 MG PO TABS: 37.5000 mg | ORAL_TABLET | Freq: Every day | ORAL | 0 refills | Status: AC

## 2023-02-10 ENCOUNTER — Encounter: Payer: Self-pay | Admitting: Family Medicine

## 2023-02-10 LAB — CBC WITH DIFFERENTIAL/PLATELET
Basophils Absolute: 0 10*3/uL (ref 0.0–0.2)
Basos: 0 %
EOS (ABSOLUTE): 0.1 10*3/uL (ref 0.0–0.4)
Eos: 1 %
Hematocrit: 46.6 % (ref 37.5–51.0)
Hemoglobin: 15.1 g/dL (ref 13.0–17.7)
Immature Grans (Abs): 0 10*3/uL (ref 0.0–0.1)
Immature Granulocytes: 0 %
Lymphocytes Absolute: 2.3 10*3/uL (ref 0.7–3.1)
Lymphs: 29 %
MCH: 29.8 pg (ref 26.6–33.0)
MCHC: 32.4 g/dL (ref 31.5–35.7)
MCV: 92 fL (ref 79–97)
Monocytes Absolute: 0.8 10*3/uL (ref 0.1–0.9)
Monocytes: 10 %
Neutrophils Absolute: 4.6 10*3/uL (ref 1.4–7.0)
Neutrophils: 60 %
Platelets: 217 10*3/uL (ref 150–450)
RBC: 5.07 x10E6/uL (ref 4.14–5.80)
RDW: 12.2 % (ref 11.6–15.4)
WBC: 7.8 10*3/uL (ref 3.4–10.8)

## 2023-02-10 LAB — CMP14+EGFR
ALT: 33 [IU]/L (ref 0–44)
AST: 31 [IU]/L (ref 0–40)
Albumin: 4.2 g/dL (ref 4.1–5.1)
Alkaline Phosphatase: 99 [IU]/L (ref 44–121)
BUN/Creatinine Ratio: 23 — ABNORMAL HIGH (ref 9–20)
BUN: 18 mg/dL (ref 6–24)
Bilirubin Total: 0.2 mg/dL (ref 0.0–1.2)
CO2: 21 mmol/L (ref 20–29)
Calcium: 8.7 mg/dL (ref 8.7–10.2)
Chloride: 107 mmol/L — ABNORMAL HIGH (ref 96–106)
Creatinine, Ser: 0.8 mg/dL (ref 0.76–1.27)
Globulin, Total: 2.7 g/dL (ref 1.5–4.5)
Glucose: 156 mg/dL — ABNORMAL HIGH (ref 70–99)
Potassium: 4.5 mmol/L (ref 3.5–5.2)
Sodium: 142 mmol/L (ref 134–144)
Total Protein: 6.9 g/dL (ref 6.0–8.5)
eGFR: 113 mL/min/{1.73_m2} (ref >59)

## 2023-02-10 LAB — LIPID PANEL
Chol/HDL Ratio: 6.4 {ratio} — ABNORMAL HIGH (ref 0.0–5.0)
Cholesterol, Total: 225 mg/dL — ABNORMAL HIGH (ref 100–199)
HDL: 35 mg/dL — ABNORMAL LOW (ref >39)
LDL Chol Calc (NIH): 127 mg/dL — ABNORMAL HIGH (ref 0–99)
Triglycerides: 355 mg/dL — ABNORMAL HIGH (ref 0–149)
VLDL Cholesterol Cal: 63 mg/dL — ABNORMAL HIGH (ref 5–40)

## 2023-02-10 NOTE — Progress Notes (Signed)
Established Patient Office Visit  Subjective    Patient ID: Frederick Hartman, male    DOB: 15-Sep-1980  Age: 42 y.o. MRN: [id number]  CC:  Chief Complaint  Patient presents with   Annual Exam    HPI Frederick Hartman presents for routine annual exam. Patient denies acute complaints or concerns.   Outpatient Encounter Medications as of 02/09/2023  Medication Sig   phentermine (ADIPEX-P) 37.5 MG tablet Take 1 tablet (37.5 mg total) by mouth daily before breakfast.   No facility-administered encounter medications on file as of 02/09/2023.    Past Medical History:  Diagnosis Date   Allergic rhinitis due to pollen    Aortic valve insufficiency    Chest pain    Chronic allergic conjunctivitis    Dizziness    Dyspepsia    Mitral regurgitation    Murmur    Other allergic rhinitis    grasses, weeds, trees, apergillus (allergy immunotherapy)   Severe aortic insufficiency 06/2014   Diastolic murmur: Transth echo showed mod/severe Aortic regurg, aortic valve not well visualized, transesoph echo recommended.  Otherwise the echo was normal.  Transesoph echo showed bicuspid AV.   Vertigo     Past Surgical History:  Procedure Laterality Date   TRANSTHORACIC ECHOCARDIOGRAM  07/14/14; 20/1/18   2016: Mod/severe Aortic regurg.  EF 60-65%, normal LV size and wall motion.  Aortic valve not well visualized: transesoph echo to follow..  Repeat 04/2016 showed EF 60-65%, severe AI w/ bicuspid AV, grade 2 DD.    Family History  Problem Relation Age of Onset   Diabetes Father    CAD Neg Hx    Cancer Neg Hx     Social History   Socioeconomic History   Marital status: Married    Spouse name: Not on file   Number of children: Not on file   Years of education: Not on file   Highest education level: Bachelor's degree (e.g., BA, AB, BS)  Occupational History   Occupation: Production designer, theatre/television/film   Tobacco Use   Smoking status: Never    Passive exposure: Never   Smokeless tobacco:  Never  Vaping Use   Vaping status: Never Used  Substance and Sexual Activity   Alcohol use: Yes    Alcohol/week: 4.0 standard drinks of alcohol    Types: 1 Glasses of wine, 3 Shots of liquor per week    Comment: socially   Drug use: No   Sexual activity: Yes  Other Topics Concern   Not on file  Social History Narrative   Married, male partner.   No children.   Orig from [place], [place], [place] since 2014.   College: BA in Accounting in [place].    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   No tob, occ wine.  No hx of alc/drug abuse.    Social Determinants of Health   Financial Resource Strain: Low Risk  (02/09/2023)   Overall Financial Resource Strain (CARDIA)    Difficulty of Paying Living Expenses: Not very hard  Food Insecurity: No Food Insecurity (02/09/2023)   Hunger Vital Sign    Worried About Running Out of Food in the Last Year: Never true    Ran Out of Food in the Last Year: Never true  Transportation Needs: No Transportation Needs (02/09/2023)   PRAPARE - Administrator, Civil Service (Medical): No    Lack of Transportation (Non-Medical): No  Physical Activity: Insufficiently Active (02/09/2023)   Exercise Vital  Sign    Days of Exercise per Week: 2 days    Minutes of Exercise per Session: 50 min  Stress: No Stress Concern Present (02/09/2023)   [employer] of Occupational Health - Occupational Stress Questionnaire    Feeling of Stress : Not at all  Social Connections: Moderately Integrated (02/09/2023)   Social Connection and Isolation Panel [NHANES]    Frequency of Communication with Friends and Family: More than three times a week    Frequency of Social Gatherings with Friends and Family: More than three times a week    Attends Religious Services: 1 to 4 times per year    Active Member of [employer] or Organizations: No    Attends Engineer, structural: Not on file    Marital Status: Married  Careers information officer Violence: Not on file    Review of Systems  All other systems reviewed and are negative.       Objective    BP 138/73 (BP Location: Right Arm, Patient Position: Sitting, Cuff Size: Large)   Pulse 97   Temp 98.8 F (37.1 C) (Oral)   Resp 16   Ht 5' 9.65" (1.769 m)   Wt 259 lb 6.4 oz (117.7 kg)   SpO2 95%   BMI 37.60 kg/m   Physical Exam Vitals and nursing note reviewed.  Constitutional:      General: He is not in acute distress. HENT:     Head: Normocephalic and atraumatic.     Right Ear: Tympanic membrane, ear canal and external ear normal.     Left Ear: Tympanic membrane, ear canal and external ear normal.     Nose: Nose normal.     Mouth/Throat:     Mouth: Mucous membranes are moist.     Pharynx: Oropharynx is clear.  Eyes:     Conjunctiva/sclera: Conjunctivae normal.     Pupils: Pupils are equal, round, and reactive to light.  Neck:     Thyroid: No thyromegaly.  Cardiovascular:     Rate and Rhythm: Normal rate and regular rhythm.     Heart sounds: Normal heart sounds. No murmur heard. Pulmonary:     Effort: Pulmonary effort is normal.     Breath sounds: Normal breath sounds.  Abdominal:     General: There is no distension.     Palpations: Abdomen is soft. There is no mass.     Tenderness: There is no abdominal tenderness.     Hernia: There is no hernia in the left inguinal area or right inguinal area.  Genitourinary:    Penis: Normal and uncircumcised.      Testes: Normal.  Musculoskeletal:        General: Normal range of motion.     Cervical back: Normal range of motion and neck supple.     Right lower leg: No edema.     Left lower leg: No edema.  Skin:    General: Skin is warm and dry.  Neurological:     General: No focal deficit present.     Mental Status: He is alert and oriented to person, place, and time. Mental status is at baseline.  Psychiatric:        Mood and Affect: Mood normal.        Behavior: Behavior normal.          Assessment & Plan:   Annual physical exam -     CMP14+EGFR  Screening for lipid disorders -     Lipid panel  Screening for deficiency anemia -     CBC with Differential/Platelet  Class 2 obesity due to excess calories without serious comorbidity with body mass index (BMI) of 37.0 to 37.9 in adult  Other orders -     Phentermine HCl; Take 1 tablet (37.5 mg total) by mouth daily before breakfast.  Dispense: 30 tablet; Refill: 0     Return in about 4 weeks (around 03/09/2023) for follow up.   Tommie Raymond, MD

## 2023-03-19 ENCOUNTER — Ambulatory Visit: Payer: Commercial Managed Care - PPO | Admitting: Family

## 2024-03-15 ENCOUNTER — Encounter: Payer: Self-pay | Admitting: Family

## 2024-03-15 ENCOUNTER — Other Ambulatory Visit (HOSPITAL_COMMUNITY)
Admission: RE | Admit: 2024-03-15 | Discharge: 2024-03-15 | Disposition: A | Discharge status: Home / Self Care | Source: Ambulatory Visit | Attending: Family | Admitting: Family

## 2024-03-15 ENCOUNTER — Ambulatory Visit (INDEPENDENT_AMBULATORY_CARE_PROVIDER_SITE_OTHER): Admitting: Family

## 2024-03-15 VITALS — BP 137/81 | HR 95 | Temp 98.6°F | Resp 16 | Ht 69.65 in | Wt 254.0 lb

## 2024-03-15 DIAGNOSIS — Z1329 Encounter for screening for other suspected endocrine disorder: Secondary | ICD-10-CM

## 2024-03-15 DIAGNOSIS — Z0000 Encounter for general adult medical examination without abnormal findings: Secondary | ICD-10-CM | POA: Diagnosis not present

## 2024-03-15 DIAGNOSIS — Z130 Encounter for screening for diseases of the blood and blood-forming organs and certain disorders involving the immune mechanism: Secondary | ICD-10-CM

## 2024-03-15 DIAGNOSIS — Z23 Encounter for immunization: Secondary | ICD-10-CM | POA: Diagnosis not present

## 2024-03-15 DIAGNOSIS — Z603 Acculturation difficulty: Secondary | ICD-10-CM

## 2024-03-15 DIAGNOSIS — Z13220 Encounter for screening for lipoid disorders: Secondary | ICD-10-CM

## 2024-03-15 DIAGNOSIS — Z113 Encounter for screening for infections with a predominantly sexual mode of transmission: Secondary | ICD-10-CM | POA: Diagnosis present

## 2024-03-15 DIAGNOSIS — Z114 Encounter for screening for human immunodeficiency virus [HIV]: Secondary | ICD-10-CM

## 2024-03-15 DIAGNOSIS — Z131 Encounter for screening for diabetes mellitus: Secondary | ICD-10-CM

## 2024-03-15 DIAGNOSIS — Z13228 Encounter for screening for other metabolic disorders: Secondary | ICD-10-CM

## 2024-03-15 NOTE — Progress Notes (Signed)
 "   Patient ID: Frederick Hartman, male    DOB: [birth year]  MRN: [id number]  CC: Annual Exam   Subjective: Frederick Hartman Frederick Hartman is a 43 y.o. male who presents for annual exam.   His concerns today include:  - Routine STD screening. States he caught his husband cheating on him. States he would like to know if his health insurance will cover cost of Prep therapy. Patient encouraged to call his health insurance for additional information. Patient declines referral to specialist.   Patient Active Problem List   Diagnosis Date Noted   Annual physical exam 09/14/2017     Medications Ordered Prior to Encounter[1]  Allergies[2]  Social History   Socioeconomic History   Marital status: Married    Spouse name: Not on file   Number of children: Not on file   Years of education: Not on file   Highest education level: Bachelor's degree (e.g., BA, AB, BS)  Occupational History   Occupation: production designer, theatre/television/film   Tobacco Use   Smoking status: Never    Passive exposure: Never   Smokeless tobacco: Never  Vaping Use   Vaping status: Never Used  Substance and Sexual Activity   Alcohol use: Yes    Alcohol/week: 4.0 standard drinks of alcohol    Types: 1 Glasses of wine, 3 Shots of liquor per week    Comment: socially   Drug use: No   Sexual activity: Yes  Other Topics Concern   Not on file  Social History Narrative   Married, male partner.   No children.   Orig from [place], [place], [place] since 2014.   College: BA in Accounting in [place].    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   No tob, occ wine.  No hx of alc/drug abuse.    Social Drivers of Health   Tobacco Use: Low Risk (03/15/2024)   Patient History    Smoking Tobacco Use: Never    Smokeless Tobacco Use: Never    Passive Exposure: Never  Financial Resource Strain: Low Risk (03/14/2024)   Overall Financial Resource Strain (CARDIA)    Difficulty of Paying Living Expenses: Not  very hard  Food Insecurity: No Food Insecurity (03/14/2024)   Epic    Worried About Programme Researcher, Broadcasting/film/video in the Last Year: Never true    Ran Out of Food in the Last Year: Never true  Transportation Needs: No Transportation Needs (03/14/2024)   Epic    Lack of Transportation (Medical): No    Lack of Transportation (Non-Medical): No  Physical Activity: Insufficiently Active (03/14/2024)   Exercise Vital Sign    Days of Exercise per Week: 2 days    Minutes of Exercise per Session: 60 min  Stress: No Stress Concern Present (03/14/2024)   [employer] of Occupational Health - Occupational Stress Questionnaire    Feeling of Stress: Only a little  Social Connections: Moderately Integrated (03/14/2024)   Social Connection and Isolation Panel    Frequency of Communication with Friends and Family: More than three times a week    Frequency of Social Gatherings with Friends and Family: Once a week    Attends Religious Services: 1 to 4 times per year    Active Member of [employer] or Organizations: No    Attends Banker Meetings: Not on file    Marital Status: Married  Intimate Partner Violence: Not on file  Depression (PHQ2-9): Low Risk (03/15/2024)   Depression (PHQ2-9)  PHQ-2 Score: 0  Alcohol Screen: Low Risk (03/14/2024)   Alcohol Screen    Last Alcohol Screening Score (AUDIT): 6  Housing: High Risk (03/14/2024)   Epic    Unable to Pay for Housing in the Last Year: Yes    Number of Times Moved in the Last Year: 0    Homeless in the Last Year: No  Utilities: Not At Risk (02/09/2023)   AHC Utilities    Threatened with loss of utilities: No  Health Literacy: Adequate Health Literacy (02/09/2023)   B1300 Health Literacy    Frequency of need for help with medical instructions: Never    Family History  Problem Relation Age of Onset   Diabetes Father    CAD Neg Hx    Cancer Neg Hx     Past Surgical History:  Procedure Laterality Date   TRANSTHORACIC ECHOCARDIOGRAM   07/14/14; 20/1/18   2016: Mod/severe Aortic regurg.  EF 60-65%, normal LV size and wall motion.  Aortic valve not well visualized: transesoph echo to follow..  Repeat 04/2016 showed EF 60-65%, severe AI w/ bicuspid AV, grade 2 DD.    ROS: Review of Systems Negative except as stated above  PHYSICAL EXAM: BP 137/81   Pulse 95   Temp 98.6 F (37 C) (Oral)   Resp 16   Ht 5' 9.65 (1.769 m)   Wt 254 lb (115.2 kg)   SpO2 95%   BMI 36.81 kg/m   Physical Exam HENT:     Head: Normocephalic and atraumatic.     Right Ear: Tympanic membrane, ear canal and external ear normal.     Left Ear: Tympanic membrane, ear canal and external ear normal.     Nose: Nose normal.     Mouth/Throat:     Mouth: Mucous membranes are moist.     Pharynx: Oropharynx is clear.  Eyes:     Extraocular Movements: Extraocular movements intact.     Conjunctiva/sclera: Conjunctivae normal.     Pupils: Pupils are equal, round, and reactive to light.  Neck:     Thyroid : No thyroid  mass, thyromegaly or thyroid  tenderness.  Cardiovascular:     Rate and Rhythm: Normal rate and regular rhythm.     Pulses: Normal pulses.     Heart sounds: Normal heart sounds.  Pulmonary:     Effort: Pulmonary effort is normal.     Breath sounds: Normal breath sounds.  Abdominal:     General: Bowel sounds are normal.     Palpations: Abdomen is soft.  Genitourinary:    Comments: Patient declined.  Musculoskeletal:        General: Normal range of motion.     Right shoulder: Normal.     Left shoulder: Normal.     Right upper arm: Normal.     Left upper arm: Normal.     Right elbow: Normal.     Left elbow: Normal.     Right forearm: Normal.     Left forearm: Normal.     Right wrist: Normal.     Left wrist: Normal.     Right hand: Normal.     Left hand: Normal.     Cervical back: Normal, normal range of motion and neck supple.     Thoracic back: Normal.     Lumbar back: Normal.     Right hip: Normal.     Left hip: Normal.      Right upper leg: Normal.     Left upper leg: Normal.  Right knee: Normal.     Left knee: Normal.     Right lower leg: Normal.     Left lower leg: Normal.     Right ankle: Normal.     Left ankle: Normal.     Right foot: Normal.     Left foot: Normal.  Skin:    General: Skin is warm and dry.     Capillary Refill: Capillary refill takes less than 2 seconds.  Neurological:     General: No focal deficit present.     Mental Status: He is alert and oriented to person, place, and time.  Psychiatric:        Mood and Affect: Mood normal.        Behavior: Behavior normal.     ASSESSMENT AND PLAN: 1. Annual physical exam (Primary) - Counseled on 150 minutes of exercise per week as tolerated, healthy eating (including decreased daily intake of saturated fats, cholesterol, added sugars, sodium), STI prevention, and routine healthcare maintenance.  2. Screening for metabolic disorder - Routine screening.  - CMP14+EGFR  3. Screening for deficiency anemia - Routine screening.  - CBC  4. Diabetes mellitus screening - Routine screening.  - Hemoglobin A1c  5. Screening cholesterol level - Routine screening.  - Lipid panel  6. Thyroid  disorder screen - Routine screening.  - TSH  7. Routine screening for STI (sexually transmitted infection) - Routine screening.  - Urine cytology ancillary only  8. Encounter for screening for HIV - Routine screening.  - HIV antibody (with reflex)  9. Immunization due - Administered.  - Flu vaccine trivalent PF, 6mos and older(Flulaval,Afluria,Fluarix,Fluzone)  10. Language barrier - Patient speaking English.   Patient was given the opportunity to ask questions.  Patient verbalized understanding of the plan and was able to repeat key elements of the plan. Patient was given clear instructions to go to Emergency Department or return to medical center if symptoms don't improve, worsen, or new problems develop.The patient verbalized  understanding.   Orders Placed This Encounter  Procedures   Flu vaccine trivalent PF, 6mos and older(Flulaval,Afluria,Fluarix,Fluzone)   CBC   Lipid panel   CMP14+EGFR   Hemoglobin A1c   TSH   HIV antibody (with reflex)    Return in about 1 year (around 03/15/2025) for Physical per patient preference.  Greig JINNY Chute, NP      [1]  Current Outpatient Medications on File Prior to Visit  Medication Sig Dispense Refill   phentermine  (ADIPEX-P ) 37.5 MG tablet Take 1 tablet (37.5 mg total) by mouth daily before breakfast. 30 tablet 0   No current facility-administered medications on file prior to visit.  [2] No Known Allergies  "

## 2024-03-16 ENCOUNTER — Ambulatory Visit: Payer: Self-pay | Admitting: Family

## 2024-03-16 DIAGNOSIS — E785 Hyperlipidemia, unspecified: Secondary | ICD-10-CM

## 2024-03-16 LAB — LIPID PANEL
Chol/HDL Ratio: 5.2 ratio — ABNORMAL HIGH (ref 0.0–5.0)
Cholesterol, Total: 224 mg/dL — ABNORMAL HIGH (ref 100–199)
HDL: 43 mg/dL
LDL Chol Calc (NIH): 162 mg/dL — ABNORMAL HIGH (ref 0–99)
Triglycerides: 107 mg/dL (ref 0–149)
VLDL Cholesterol Cal: 19 mg/dL (ref 5–40)

## 2024-03-16 LAB — CMP14+EGFR
ALT: 39 IU/L (ref 0–44)
AST: 29 IU/L (ref 0–40)
Albumin: 4.4 g/dL (ref 4.1–5.1)
Alkaline Phosphatase: 81 IU/L (ref 47–123)
BUN/Creatinine Ratio: 20 (ref 9–20)
BUN: 16 mg/dL (ref 6–24)
Bilirubin Total: 0.5 mg/dL (ref 0.0–1.2)
CO2: 23 mmol/L (ref 20–29)
Calcium: 9.7 mg/dL (ref 8.7–10.2)
Chloride: 106 mmol/L (ref 96–106)
Creatinine, Ser: 0.81 mg/dL (ref 0.76–1.27)
Globulin, Total: 2.9 g/dL (ref 1.5–4.5)
Glucose: 102 mg/dL — ABNORMAL HIGH (ref 70–99)
Potassium: 4.9 mmol/L (ref 3.5–5.2)
Sodium: 141 mmol/L (ref 134–144)
Total Protein: 7.3 g/dL (ref 6.0–8.5)
eGFR: 111 mL/min/1.73

## 2024-03-16 LAB — CBC
Hematocrit: 49.4 % (ref 37.5–51.0)
Hemoglobin: 16.3 g/dL (ref 13.0–17.7)
MCH: 30.3 pg (ref 26.6–33.0)
MCHC: 33 g/dL (ref 31.5–35.7)
MCV: 92 fL (ref 79–97)
Platelets: 277 x10E3/uL (ref 150–450)
RBC: 5.38 x10E6/uL (ref 4.14–5.80)
RDW: 12.2 % (ref 11.6–15.4)
WBC: 7.1 x10E3/uL (ref 3.4–10.8)

## 2024-03-16 LAB — URINE CYTOLOGY ANCILLARY ONLY
Chlamydia: NEGATIVE
Comment: NEGATIVE
Comment: NEGATIVE
Comment: NORMAL
Neisseria Gonorrhea: NEGATIVE
Trichomonas: NEGATIVE

## 2024-03-16 LAB — TSH: TSH: 1.81 u[IU]/mL (ref 0.450–4.500)

## 2024-03-16 LAB — HIV ANTIBODY (ROUTINE TESTING W REFLEX): HIV Screen 4th Generation wRfx: NONREACTIVE

## 2024-03-16 LAB — HEMOGLOBIN A1C
Est. average glucose Bld gHb Est-mCnc: 114 mg/dL
Hgb A1c MFr Bld: 5.6 % (ref 4.8–5.6)

## 2024-03-16 MED ORDER — ATORVASTATIN CALCIUM 20 MG PO TABS: 20.0000 mg | ORAL_TABLET | Freq: Every day | ORAL | 0 refills | Status: AC

## 2024-03-22 ENCOUNTER — Other Ambulatory Visit: Payer: Self-pay | Admitting: Family Medicine

## 2024-03-22 NOTE — Telephone Encounter (Signed)
 Schedule appointment?

## 2024-03-22 NOTE — Telephone Encounter (Signed)
"  Appt scheduled  "

## 2024-03-22 NOTE — Telephone Encounter (Signed)
 Pt scheduled

## 2024-03-23 ENCOUNTER — Emergency Department (HOSPITAL_BASED_OUTPATIENT_CLINIC_OR_DEPARTMENT_OTHER)

## 2024-03-23 ENCOUNTER — Other Ambulatory Visit: Payer: Self-pay

## 2024-03-23 ENCOUNTER — Encounter (HOSPITAL_BASED_OUTPATIENT_CLINIC_OR_DEPARTMENT_OTHER): Payer: Self-pay

## 2024-03-23 ENCOUNTER — Emergency Department (HOSPITAL_BASED_OUTPATIENT_CLINIC_OR_DEPARTMENT_OTHER)
Admission: EM | Admit: 2024-03-23 | Discharge: 2024-03-23 | Disposition: A | Discharge status: Home / Self Care | Attending: Emergency Medicine | Admitting: Emergency Medicine

## 2024-03-23 DIAGNOSIS — R0789 Other chest pain: Secondary | ICD-10-CM | POA: Insufficient documentation

## 2024-03-23 DIAGNOSIS — R739 Hyperglycemia, unspecified: Secondary | ICD-10-CM | POA: Diagnosis not present

## 2024-03-23 HISTORY — DX: Pure hypercholesterolemia, unspecified: E78.00

## 2024-03-23 LAB — CBC
HCT: 43.8 % (ref 39.0–52.0)
Hemoglobin: 14.9 g/dL (ref 13.0–17.0)
MCH: 29.9 pg (ref 26.0–34.0)
MCHC: 34 g/dL (ref 30.0–36.0)
MCV: 87.8 fL (ref 80.0–100.0)
Platelets: 209 K/uL (ref 150–400)
RBC: 4.99 MIL/uL (ref 4.22–5.81)
RDW: 12.5 % (ref 11.5–15.5)
WBC: 9.2 K/uL (ref 4.0–10.5)
nRBC: 0 % (ref 0.0–0.2)

## 2024-03-23 LAB — BASIC METABOLIC PANEL WITH GFR
Anion gap: 12 (ref 5–15)
BUN: 21 mg/dL — ABNORMAL HIGH (ref 6–20)
CO2: 24 mmol/L (ref 22–32)
Calcium: 8.9 mg/dL (ref 8.9–10.3)
Chloride: 104 mmol/L (ref 98–111)
Creatinine, Ser: 0.9 mg/dL (ref 0.61–1.24)
GFR, Estimated: >60 mL/min
Glucose, Bld: 154 mg/dL — ABNORMAL HIGH (ref 70–99)
Potassium: 3.8 mmol/L (ref 3.5–5.1)
Sodium: 139 mmol/L (ref 135–145)

## 2024-03-23 LAB — TROPONIN T, HIGH SENSITIVITY
Troponin T High Sensitivity: <15 ng/L (ref 0–19)
Troponin T High Sensitivity: <15 ng/L (ref 0–19)

## 2024-03-23 NOTE — ED Notes (Signed)
 Pt maintained an 02 reading of 96% while ambulating in department and no change to HR it was maintained at 105

## 2024-03-23 NOTE — ED Triage Notes (Signed)
 Pt reports his chest feels weird but is not able to accurately describe the sensation, onset tonight around 2330 while sitting on the couch, then he stood up and began to feel dizzy. Denies shortness of breath, no N/V. He reports he started taking Atorvastatin  today. Took it today at 12pm. He denies any actual physical pain anywhere.

## 2024-03-23 NOTE — ED Provider Notes (Signed)
 " [place] [facility] Provider Note   CSN: [id number] Arrival date & time: 03/23/24  0008     Patient presents with: Chest Pain   Frederick Hartman Frederick Hartman is a 44 y.o. male.   The history is provided by the patient.  Chest Pain  He has a history of hyperlipidemia, bicuspid aortic valve with aortic regurgitation and comes in because of feeling weird in his chest.  He states that he was cleaning up after dinner when he developed this weird feeling.  He also noted some numbness and tingling in both arms.  He denies dyspnea, nausea, diaphoresis.  He denies any chest pain or heaviness or tightness or pressure.  Symptoms lasted about 30 minutes before resolving.  He feels like he is back to baseline.  Of note, he was started on atorvastatin  today because of elevated cholesterol.    Prior to Admission medications  Medication Sig Start Date End Date Taking? Authorizing Provider  atorvastatin  (LIPITOR) 20 MG tablet Take 1 tablet (20 mg total) by mouth daily. 03/16/24   Jaycee Greig PARAS, NP  phentermine  (ADIPEX-P ) 37.5 MG tablet Take 1 tablet (37.5 mg total) by mouth daily before breakfast. 02/09/23   Tanda Bleacher, MD    Allergies: Patient has no known allergies.    Review of Systems  Cardiovascular:  Positive for chest pain.  All other systems reviewed and are negative.   Updated Vital Signs BP (!) 153/81 (BP Location: Right Arm)   Pulse (!) 124   Temp 98 F (36.7 C)   Resp 18   Ht 5' 11 (1.803 m)   Wt 111.1 kg   SpO2 97%   BMI 34.17 kg/m   Physical Exam Vitals and nursing note reviewed.   44 year old male, resting comfortably and in no acute distress. Vital signs are significant for elevated heart rate and blood pressure. Oxygen saturation is 97%, which is normal. Head is normocephalic and atraumatic. PERRLA, EOMI.  Lungs are clear without rales, wheezes, or rhonchi. Heart has regular rate and rhythm with 1-2/6 early systolic  murmur. Abdomen is soft, flat, nontender. Neurologic: Awake and alert, moves all extremities equally.  (all labs ordered are listed, but only abnormal results are displayed) Labs Reviewed  BASIC METABOLIC PANEL WITH GFR - Abnormal; Notable for the following components:      Result Value   Glucose, Bld 154 (*)    BUN 21 (*)    All other components within normal limits  CBC  TROPONIN T, HIGH SENSITIVITY  TROPONIN T, HIGH SENSITIVITY    EKG: ED ECG REPORT   Date: 03/23/2024  Rate: 121  Rhythm: sinus tachycardia  QRS Axis: normal  Intervals: normal  ST/T Wave abnormalities: nonspecific T wave changes  Conduction Disutrbances:none  Narrative Interpretation: Sinus tachycardia, left ventricular hypertrophy with secondary repolarization changes.  When compared with ECG of May 13, 2016, heart rate has increased but no other changes are seen.  Old EKG Reviewed: unchanged  I have personally reviewed the EKG tracing and agree with the computerized printout as noted.  Radiology: DG Chest 2 View Result Date: 03/23/2024 EXAM: 2 VIEW(S) XRAY OF THE CHEST 03/23/2024 12:31:00 AM COMPARISON: Comparison to 05/13/2016. CLINICAL HISTORY: cp cp FINDINGS: LUNGS AND PLEURA: No focal pulmonary opacity. No pleural effusion. No pneumothorax. HEART AND MEDIASTINUM: No acute abnormality of the cardiac and mediastinal silhouettes. BONES AND SOFT TISSUES: No acute osseous abnormality. IMPRESSION: 1. No active cardiopulmonary disease. Electronically signed by: Franky  Dover MD 03/23/2024 12:34 AM EST RP Workstation: [id number]     Procedures   Medications Ordered in the ED - No data to display                                  Medical Decision Making Amount and/or Complexity of Data Reviewed Labs: ordered. Radiology: ordered.   Funny feeling in chest of uncertain cause.  I have reviewed his electrocardiogram, and my interpretation is sinus tachycardia with left ventricular hypertrophy and secondary  repolarization changes-only change from prior is faster rate.  However, when I examined him in the room, heart rate was 94.  He was feeling better at this point.  I have reviewed his laboratory tests, and my interpretation is elevated random glucose level and otherwise normal basic metabolic panel, normal CBC, normal troponin x 2.  Chest x-ray shows no active cardiopulmonary disease.  Have independently viewed the images, and agree with the radiologist's interpretation.  I have ordered orthostatic vital signs and a trial of ambulation to see if he demonstrates tachyphylaxis.  Orthostatic vital signs did show a rise in heart rate but not significant rise, no change in blood pressure.  He was able to ambulate maintaining a heart rate which was slightly elevated at 105, but not excessively tachycardic.  I feel he is safe for discharge, but I am referring him back to his cardiologist for reevaluation.  I have encouraged him to increase his fluid intake.     Final diagnoses:  Chest discomfort  Elevated random blood glucose level    ED Discharge Orders     None          Raford Lenis, MD 03/23/24 [phone]  "

## 2024-03-23 NOTE — Discharge Instructions (Signed)
 Please make sure that you are drinking enough fluids.  Please follow-up with your cardiologist.  Return to the emergency department if you have any concerning symptoms.

## 2024-03-23 NOTE — ED Notes (Signed)
 Patient transported to X-ray

## 2024-03-24 NOTE — Telephone Encounter (Signed)
 Pt is already scheduled for [facility] for med refill

## 2024-04-01 ENCOUNTER — Ambulatory Visit: Payer: Self-pay | Admitting: Family

## 2024-04-25 ENCOUNTER — Ambulatory Visit: Admitting: Family

## 2024-07-01 ENCOUNTER — Encounter: Admitting: Family

## 2024-08-09 ENCOUNTER — Encounter: Admitting: Family

## 2025-03-16 ENCOUNTER — Encounter: Payer: Self-pay | Admitting: Family
